# Patient Record
Sex: Female | Born: 2000 | Race: Black or African American | Hispanic: No | Marital: Single | Smoking: Never smoker
Health system: Southern US, Community
[De-identification: ages and names within clinical notes are randomized; demographics above are authoritative.]

---
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
## 2023-04-03 ENCOUNTER — Other Ambulatory Visit: Payer: Self-pay

## 2023-04-03 ENCOUNTER — Ambulatory Visit (HOSPITAL_COMMUNITY)
Admission: EM | Admit: 2023-04-03 | Discharge: 2023-04-03 | Disposition: A | Discharge status: Other Acute Inpatient Hospital | Payer: Medicaid Other | Attending: Behavioral Health | Admitting: Behavioral Health

## 2023-04-03 ENCOUNTER — Encounter (HOSPITAL_COMMUNITY): Payer: Self-pay | Admitting: Behavioral Health

## 2023-04-03 ENCOUNTER — Inpatient Hospital Stay (HOSPITAL_COMMUNITY)
Admission: AD | Admit: 2023-04-03 | Discharge: 2023-04-11 | DRG: 885 | Disposition: A | Discharge status: Home / Self Care | Payer: Medicaid Other | Source: Intra-hospital | Attending: Psychiatry | Admitting: Psychiatry

## 2023-04-03 DIAGNOSIS — F17290 Nicotine dependence, other tobacco product, uncomplicated: Secondary | ICD-10-CM | POA: Diagnosis present

## 2023-04-03 DIAGNOSIS — R45851 Suicidal ideations: Secondary | ICD-10-CM | POA: Diagnosis present

## 2023-04-03 DIAGNOSIS — R000 Tachycardia, unspecified: Secondary | ICD-10-CM | POA: Insufficient documentation

## 2023-04-03 DIAGNOSIS — Z5941 Food insecurity: Secondary | ICD-10-CM

## 2023-04-03 DIAGNOSIS — Z560 Unemployment, unspecified: Secondary | ICD-10-CM

## 2023-04-03 DIAGNOSIS — F411 Generalized anxiety disorder: Secondary | ICD-10-CM | POA: Diagnosis present

## 2023-04-03 DIAGNOSIS — Z818 Family history of other mental and behavioral disorders: Secondary | ICD-10-CM

## 2023-04-03 DIAGNOSIS — K5900 Constipation, unspecified: Secondary | ICD-10-CM | POA: Diagnosis present

## 2023-04-03 DIAGNOSIS — F332 Major depressive disorder, recurrent severe without psychotic features: Secondary | ICD-10-CM | POA: Diagnosis present

## 2023-04-03 DIAGNOSIS — K30 Functional dyspepsia: Secondary | ICD-10-CM | POA: Diagnosis present

## 2023-04-03 DIAGNOSIS — F4310 Post-traumatic stress disorder, unspecified: Secondary | ICD-10-CM | POA: Insufficient documentation

## 2023-04-03 DIAGNOSIS — Z79899 Other long term (current) drug therapy: Secondary | ICD-10-CM

## 2023-04-03 LAB — POCT URINE DRUG SCREEN - MANUAL ENTRY (I-SCREEN)
POC Amphetamine UR: NOT DETECTED
POC Buprenorphine (BUP): NOT DETECTED
POC Cocaine UR: NOT DETECTED
POC Marijuana UR: POSITIVE — AB
POC Methadone UR: NOT DETECTED
POC Methamphetamine UR: NOT DETECTED
POC Morphine: NOT DETECTED
POC Oxazepam (BZO): NOT DETECTED
POC Oxycodone UR: NOT DETECTED
POC Secobarbital (BAR): NOT DETECTED

## 2023-04-03 LAB — HEMOGLOBIN A1C
Hgb A1c MFr Bld: 5.2 % (ref 4.8–5.6)
Mean Plasma Glucose: 102.54 mg/dL

## 2023-04-03 LAB — CBC WITH DIFFERENTIAL/PLATELET
Abs Immature Granulocytes: 0 10*3/uL (ref 0.00–0.07)
Basophils Absolute: 0 10*3/uL (ref 0.0–0.1)
Basophils Relative: 1 %
Eosinophils Absolute: 0 10*3/uL (ref 0.0–0.5)
Eosinophils Relative: 1 %
HCT: 40 % (ref 36.0–46.0)
Hemoglobin: 13.4 g/dL (ref 12.0–15.0)
Immature Granulocytes: 0 %
Lymphocytes Relative: 49 %
Lymphs Abs: 1.4 10*3/uL (ref 0.7–4.0)
MCH: 30.9 pg (ref 26.0–34.0)
MCHC: 33.5 g/dL (ref 30.0–36.0)
MCV: 92.2 fL (ref 80.0–100.0)
Monocytes Absolute: 0.2 10*3/uL (ref 0.1–1.0)
Monocytes Relative: 8 %
Neutro Abs: 1.2 10*3/uL — ABNORMAL LOW (ref 1.7–7.7)
Neutrophils Relative %: 41 %
Platelets: 213 10*3/uL (ref 150–400)
RBC: 4.34 MIL/uL (ref 3.87–5.11)
RDW: 12 % (ref 11.5–15.5)
WBC: 2.9 10*3/uL — ABNORMAL LOW (ref 4.0–10.5)
nRBC: 0 % (ref 0.0–0.2)

## 2023-04-03 LAB — LIPID PANEL
Cholesterol: 225 mg/dL — ABNORMAL HIGH (ref 0–200)
HDL: 76 mg/dL (ref >40)
LDL Cholesterol: 140 mg/dL — ABNORMAL HIGH (ref 0–99)
Total CHOL/HDL Ratio: 3 RATIO
Triglycerides: 44 mg/dL (ref <150)
VLDL: 9 mg/dL (ref 0–40)

## 2023-04-03 LAB — COMPREHENSIVE METABOLIC PANEL
ALT: 14 U/L (ref 0–44)
AST: 18 U/L (ref 15–41)
Albumin: 4.5 g/dL (ref 3.5–5.0)
Alkaline Phosphatase: 54 U/L (ref 38–126)
Anion gap: 13 (ref 5–15)
BUN: <5 mg/dL — ABNORMAL LOW (ref 6–20)
CO2: 22 mmol/L (ref 22–32)
Calcium: 9.6 mg/dL (ref 8.9–10.3)
Chloride: 103 mmol/L (ref 98–111)
Creatinine, Ser: 0.65 mg/dL (ref 0.44–1.00)
GFR, Estimated: >60 mL/min (ref >60)
Glucose, Bld: 85 mg/dL (ref 70–99)
Potassium: 4.1 mmol/L (ref 3.5–5.1)
Sodium: 138 mmol/L (ref 135–145)
Total Bilirubin: 0.7 mg/dL (ref 0.3–1.2)
Total Protein: 8.7 g/dL — ABNORMAL HIGH (ref 6.5–8.1)

## 2023-04-03 LAB — POC URINE PREG, ED: Preg Test, Ur: NEGATIVE

## 2023-04-03 LAB — MAGNESIUM: Magnesium: 2 mg/dL (ref 1.7–2.4)

## 2023-04-03 LAB — ETHANOL: Alcohol, Ethyl (B): <10 mg/dL (ref <10)

## 2023-04-03 LAB — TSH: TSH: 0.657 u[IU]/mL (ref 0.350–4.500)

## 2023-04-03 LAB — POCT PREGNANCY, URINE: Preg Test, Ur: NEGATIVE

## 2023-04-03 MED ORDER — MAGNESIUM HYDROXIDE 400 MG/5ML PO SUSP: 30.0000 mL | Freq: Every day | ORAL | Status: DC | PRN

## 2023-04-03 MED ORDER — TRAZODONE HCL 50 MG PO TABS: 50.0000 mg | ORAL_TABLET | Freq: Every evening | ORAL | Status: DC | PRN

## 2023-04-03 MED ORDER — ALUM & MAG HYDROXIDE-SIMETH 200-200-20 MG/5ML PO SUSP: 30.0000 mL | ORAL | Status: DC | PRN

## 2023-04-03 MED ORDER — QUETIAPINE FUMARATE ER 50 MG PO TB24: 50.0000 mg | ORAL_TABLET | Freq: Every day | ORAL | Status: DC

## 2023-04-03 MED ORDER — ACETAMINOPHEN 325 MG PO TABS: 650.0000 mg | ORAL_TABLET | Freq: Four times a day (QID) | ORAL | Status: DC | PRN

## 2023-04-03 MED ORDER — HYDROXYZINE HCL 25 MG PO TABS: 25.0000 mg | ORAL_TABLET | Freq: Three times a day (TID) | ORAL | Status: DC | PRN

## 2023-04-03 NOTE — ED Provider Notes (Cosign Needed Addendum)
[facility]gent [facility] Continuous Assessment Admission H&P  Date: 04/03/23 Patient Name: Charlotte Norman MRN: [id number] Chief Complaint: "My therapist called the police"  Diagnoses:  Final diagnoses:  MDD (major depressive disorder), recurrent severe, without psychosis (HCC)  Suicidal ideation  GAD (generalized anxiety disorder)    HPI: Charlotte Norman is a 22 y.o. female patient with a past psychiatric history of MDD and GAD who presented to [facility] voluntarily via [facility] with complaints of suicidal ideation and worsening depression and anxiety. Patient reports her therapist contacted police to bring her to [facility] after she expressed suicidal thoughts during her visit today.  Patient assessed face-to-face by this provider and chart reviewed on 04/03/23. Counselor Beryle Flock present during assessment. On evaluation, Charlotte Norman is seated in assessment area in no acute distress. Patient is alert and oriented x4, cooperative and pleasant. Speech is clear and coherent, normal rate and volume. Eye contact is fair. Mood is anxious and depressed with tearful and congruent affect. Thought process is coherent with thought content that consists of suicidal ideations. Patient reports passive suicidal ideations with no specific plan or intent, however states she has envisioned herself cutting her wrists. Patient states she has experienced ongoing suicidal ideations since graduating high school, "I think about it every day, I wish I didn't exist, I wake up every morning disappointed and start crying because it's another day, I really want to die, with all my being I wish I never existed, I don't feel equipped to deal with disappointment, I don't understand why I try." Patient states her suicidal ideations worsened yesterday after she had a panic attack following a conversation with her brother in which he told her to "give up housing on campus" when she expressed she can't afford to go  back to school and live on campus. Patient unable to contact for safety at this time. Patient reports in 2021 she had thoughts of overdosing on her prescribed Lexapro but did not follow through with an attempt. Patient reports she has experienced panic attacks for 4 years and typically has 2-3/week. Patient denies homicidal ideations. Patient denies a history of self-harm. Patient denies past psychiatric hospitalizations. Patient denies auditory and visual hallucinations. Patient denies symptoms of paranoia but states she always feels as if she is doing something wrong or is in trouble. Patient is able to converse coherently with goal-directed thoughts and no distractibility or preoccupation. Objectively, there is no evidence of psychosis/mania, delusional thinking, or indication that patient is responding to internal or external stimuli.  Patient reports increased sleep (12 hours/night) and decreased appetite. Patient states she is currently residing with her mother and denies access to weapons/firearms. Patient states she will be entering her Sophomore year at [employer]. Patient reports she used to attend [employer] but experienced bullying from her roommate and had to leave. Patient reports marijuana use 1.5 months ago. Patient states when she is able to afford marijuana, she will smoke it daily. Patient denies use of alcohol or other illicit substances. Patient states she had her first visit with her therapist "Shauntay" at [facility] today and will be seeing her 1x/week. Patient states she is currently prescribed Seroquel 25mg  BID at [facility] but hasn't taken it in over a week due to being sick. Patient states in 2021, she was prescribed Lexapro but "it made me worse so they thought I may have a mood disorder." Patient states her therapist is currently screening her for a mood disorder. Patient reports her biological father has history of paranoia, half-brother  has "signs of schizophrenia," another half-brother has  bipolar disorder, and "all my family has mental disorders." Patient states "may I should end it all before I go off the deep end." Patient shares that she has no friends, her brothers are her support system, and her mother "doesn't believe in mental health."   Patient offered support and encouragement. Discussed with patient recommendations for inpatient psychiatric treatment. Discussed with patient continuing Seroquel but taking Seroquel XR 50mg  daily at bedtime. Discussed with patient admission to the continuous observation unit for safety monitoring while awaiting inpatient psychiatric placement. Patient is in agreement with plan of care.   Total Time spent with patient: 1 hour  Musculoskeletal  Strength & Muscle Tone: within normal limits Gait & Station: normal Patient leans: N/A  Psychiatric Specialty Exam  Presentation General Appearance:  Appropriate for Environment  Eye Contact: Fair  Speech: Clear and Coherent; Normal Rate  Speech Volume: Normal  Handedness: Right   Mood and Affect  Mood: Depressed; Anxious  Affect: Congruent; Tearful   Thought Process  Thought Processes: Coherent; Goal Directed  Descriptions of Associations:Intact  Orientation:Full (Time, Place and Person)  Thought Content:Logical    Hallucinations:Hallucinations: None  Ideas of Reference:None  Suicidal Thoughts:Suicidal Thoughts: Yes, Passive SI Passive Intent and/or Plan: Without Plan; Without Intent; Without Means to Carry Out; Without Access to Means  Homicidal Thoughts:Homicidal Thoughts: No   Sensorium  Memory: Immediate Good; Recent Good; Remote Good  Judgment: Fair  Insight: Fair   Art therapist  Concentration: Good  Attention Span: Good  Recall: Good  Fund of Knowledge: Good  Language: Good   Psychomotor Activity  Psychomotor Activity: Psychomotor Activity: Normal   Assets  Assets: Communication Skills; Desire for Improvement;  Financial Resources/Insurance; Housing; Leisure Time; Physical Health; Resilience; Social Support; Talents/Skills; Transportation   Sleep  Sleep: Sleep: Good Number of Hours of Sleep: 12   Nutritional Assessment (For OBS and FBC admissions only) Has the patient had a weight loss or gain of 10 pounds or more in the last 3 months?: No Has the patient had a decrease in food intake/or appetite?: Yes Does the patient have dental problems?: No Does the patient have eating habits or behaviors that may be indicators of an eating disorder including binging or inducing vomiting?: No Has the patient recently lost weight without trying?: 0 Has the patient been eating poorly because of a decreased appetite?: 1 Malnutrition Screening Tool Score: 1    Physical Exam Vitals and nursing note reviewed.  Constitutional:      General: She is not in acute distress.    Appearance: Normal appearance. She is not ill-appearing.  HENT:     Head: Normocephalic and atraumatic.     Nose: Nose normal.  Eyes:     General:        Right eye: No discharge.        Left eye: No discharge.     Conjunctiva/sclera: Conjunctivae normal.  Cardiovascular:     Rate and Rhythm: Tachycardia present.  Pulmonary:     Effort: Pulmonary effort is normal. No respiratory distress.  Musculoskeletal:        General: Normal range of motion.     Cervical back: Normal range of motion.  Skin:    General: Skin is warm and dry.  Neurological:     General: No focal deficit present.     Mental Status: She is alert and oriented to person, place, and time. Mental status is at baseline.  Psychiatric:  Attention and Perception: Attention and perception normal.        Mood and Affect: Mood is anxious and depressed. Affect is tearful.        Speech: Speech normal.        Behavior: Behavior normal. Behavior is cooperative.        Thought Content: Thought content is not paranoid or delusional. Thought content includes suicidal  ideation. Thought content does not include homicidal ideation. Thought content does not include homicidal or suicidal plan.        Cognition and Memory: Cognition and memory normal.        Judgment: Judgment normal.    Review of Systems  Constitutional: Negative.   HENT: Negative.    Eyes: Negative.   Respiratory: Negative.    Cardiovascular: Negative.   Gastrointestinal: Negative.   Genitourinary: Negative.   Musculoskeletal: Negative.   Skin: Negative.   Neurological: Negative.   Endo/Heme/Allergies: Negative.   Psychiatric/Behavioral:  Positive for depression and suicidal ideas. Negative for hallucinations, memory loss and substance abuse. The patient is nervous/anxious. The patient does not have insomnia.     Blood pressure (!) 134/92, pulse (!) 117, temperature 98.3 F (36.8 C), temperature source Oral, resp. rate 20, SpO2 100%. There is no height or weight on file to calculate BMI.  Past Psychiatric History: MDD, GAD  Is the patient at risk to self? Yes  Has the patient been a risk to self in the past 6 months? Yes .    Has the patient been a risk to self within the distant past? Yes   Is the patient a risk to others? No   Has the patient been a risk to others in the past 6 months? No   Has the patient been a risk to others within the distant past? No   Past Medical History: None reported  Family History:  Family History  Problem Relation Age of Onset   Asthma Other   -Patient reports biological father has hx of paranoia, half-brother has "signs of schizophrenia," another half-brother has bipolar disorder, and "all my family has mental disorders"  Social History:  Social History   Tobacco Use   Smoking status: Never   Smokeless tobacco: Never  Substance Use Topics   Alcohol use: No   Drug use: Never   Last Labs:  No visits with results within 6 Month(s) from this visit.  Latest known visit with results is:  Office Visit on 06/30/2019  Component Date Value  Ref Range Status   Preg Test, Ur 06/30/2019 Negative  Negative Final   Neisseria Gonorrhea 06/30/2019 Negative   Final   Chlamydia 06/30/2019 Positive (A)   Final   Trichomonas 06/30/2019 Negative   Final   Bacterial Vaginitis (gardnerella) 06/30/2019 Positive (A)   Final   Candida Vaginitis 06/30/2019 Negative   Final   Candida Glabrata 06/30/2019 Negative   Final   Comment 06/30/2019 Normal Reference Range Bacterial Vaginosis - Negative   Final   Comment 06/30/2019 Normal Reference Range Candida Species - Negative   Final   Comment 06/30/2019 Normal Reference Range Candida Galbrata - Negative   Final   Comment 06/30/2019 Normal Reference Range Trichomonas - Negative   Final   Comment 06/30/2019 Normal Reference Ranger Chlamydia - Negative   Final   Comment 06/30/2019 Normal Reference Range Neisseria Gonorrhea - Negative   Final   HIV Screen 4th Generation wRfx 06/30/2019 Non Reactive  Non Reactive Final   WBC 06/30/2019 4.2  3.4 -  10.8 x10E3/uL Final   RBC 06/30/2019 4.24  3.77 - 5.28 x10E6/uL Final   Hemoglobin 06/30/2019 12.7  11.1 - 15.9 g/dL Final   Hematocrit 16/06/9603 38.4  34.0 - 46.6 % Final   MCV 06/30/2019 91  79 - 97 fL Final   MCH 06/30/2019 30.0  26.6 - 33.0 pg Final   MCHC 06/30/2019 33.1  31.5 - 35.7 g/dL Final   RDW 54/05/8118 12.9  11.7 - 15.4 % Final   Platelets 06/30/2019 377  150 - 450 x10E3/uL Final   Neutrophils 06/30/2019 50  Not Estab. % Final   Lymphs 06/30/2019 35  Not Estab. % Final   Monocytes 06/30/2019 13  Not Estab. % Final   Eos 06/30/2019 1  Not Estab. % Final   Basos 06/30/2019 1  Not Estab. % Final   Neutrophils Absolute 06/30/2019 2.1  1.4 - 7.0 x10E3/uL Final   Lymphocytes Absolute 06/30/2019 1.4  0.7 - 3.1 x10E3/uL Final   Monocytes Absolute 06/30/2019 0.6  0.1 - 0.9 x10E3/uL Final   EOS (ABSOLUTE) 06/30/2019 0.1  0.0 - 0.4 x10E3/uL Final   Basophils Absolute 06/30/2019 0.0  0.0 - 0.3 x10E3/uL Final   Immature Granulocytes 06/30/2019 0  Not  Estab. % Final   Immature Grans (Abs) 06/30/2019 0.0  0.0 - 0.1 x10E3/uL Final   Glucose 06/30/2019 93  65 - 99 mg/dL Final   BUN 14/78/2956 7  5 - 18 mg/dL Final   Creatinine, Ser 06/30/2019 0.76  0.57 - 1.00 mg/dL Final   GFR calc non Af Amer 06/30/2019 CANCELED  mL/min/1.73 Final-Edited   Comment: Unable to calculate GFR.  Age and/or sex not provided or age <84 years old.  Result canceled by the ancillary.    GFR calc Af Amer 06/30/2019 CANCELED  mL/min/1.73 Final-Edited   Comment: Unable to calculate GFR.  Age and/or sex not provided or age <21 years old.  Result canceled by the ancillary.    BUN/Creatinine Ratio 06/30/2019 9 (L)  10 - 22 Final   Sodium 06/30/2019 140  134 - 144 mmol/L Final   Potassium 06/30/2019 4.9  3.5 - 5.2 mmol/L Final   Chloride 06/30/2019 104  96 - 106 mmol/L Final   CO2 06/30/2019 22  20 - 29 mmol/L Final   Calcium 06/30/2019 9.8  8.9 - 10.4 mg/dL Final   Total Protein 21/30/8657 7.7  6.0 - 8.5 g/dL Final   Albumin 84/69/6295 4.6  3.9 - 5.0 g/dL Final   Globulin, Total 06/30/2019 3.1  1.5 - 4.5 g/dL Final   Albumin/Globulin Ratio 06/30/2019 1.5  1.2 - 2.2 Final   Bilirubin Total 06/30/2019 0.2  0.0 - 1.2 mg/dL Final   Alkaline Phosphatase 06/30/2019 82  45 - 101 IU/L Final   AST 06/30/2019 15  0 - 40 IU/L Final   ALT 06/30/2019 10  0 - 24 IU/L Final    Allergies: Penicillins  Medications:  Facility Ordered Medications  Medication   acetaminophen (TYLENOL) tablet 650 mg   alum & mag hydroxide-simeth (MAALOX/MYLANTA) 200-200-20 MG/5ML suspension 30 mL   magnesium hydroxide (MILK OF MAGNESIA) suspension 30 mL   hydrOXYzine (ATARAX) tablet 25 mg   QUEtiapine (SEROQUEL XR) 24 hr tablet 50 mg   PTA Medications  Medication Sig   TRI-LO-MARZIA 0.18/0.215/0.25 MG-25 MCG tab TAKE 1 TABLET BY MOUTH EVERY DAY      Medical Decision Making  Charlotte Norman was admitted to [facility] continuous assessment unit  for MDD, recurrent, severe, without  psychosis, suicidal ideation, GAD (generalized anxiety disorder), crisis management, medication administration, and stabilization. Patient remains voluntary.  Routine labs ordered, which include Lab Orders         CBC with Differential/Platelet         Comprehensive metabolic panel         Hemoglobin A1c         Magnesium         Ethanol         Lipid panel         TSH         Prolactin         POC urine preg, ED         POCT Urine Drug Screen - (I-Screen)    -EKG Medication Management: Medications started, continue home medications as appropriate  Meds ordered this encounter  Medications   acetaminophen (TYLENOL) tablet 650 mg   alum & mag hydroxide-simeth (MAALOX/MYLANTA) 200-200-20 MG/5ML suspension 30 mL   magnesium hydroxide (MILK OF MAGNESIA) suspension 30 mL   hydrOXYzine (ATARAX) tablet 25 mg   DISCONTD: traZODone (DESYREL) tablet 50 mg   QUEtiapine (SEROQUEL XR) 24 hr tablet 50 mg    Will maintain observation checks every 15 minutes for safety. Psychosocial education regarding relapse prevention and self-care; social and communication   Recommendations  Based on my evaluation the patient does not appear to have an emergency medical condition.  -Start Seroquel XR 50mg  daily at bedtime -PRN orders placed for: Tylenol 650mg  Q6H PRN mild pain, Maalox 30ml Q4H PRN indigestion, hydroxyzine 25mg  TID PRN anxiety, and Milk of Magnesia 30ml daily PRN mild constipation -Recommend inpatient psychiatric treatment. Per Rona Ravens, RN/[facility], patient has been accepted to [facility] [phone] today pending resulted lab work, voluntary consent form faxed, and EKG, accepting provider will be Dr. Sherron Flemings.  Sunday Corn, NP 04/03/23  5:45 PM

## 2023-04-03 NOTE — ED Notes (Signed)
Safe Transport requested to [facility], [address].
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
## 2023-04-03 NOTE — ED Notes (Signed)
22 yo AAF, present to [employer] [facility], secondary to making suicidal statement. She is A&O to all spheres. Patient denied having active though, plan or intent at this time, stating it "wing and wang' based on the situation, Pt reports stressors r/t to finances, student loans and is currently unemployed and living with her mother. Pt also denied having any medical issues and she did not present in any acute distress. Pt noted with moderate acne on face and upper torso otherwise unremarkable, slimmer fit. Pt does have tattoos to her left wrist, and lower right leg. Pt belongings was searched and was without contraband. She reports using THC at times but, none for the past three weeks, denies ETOH use and stated she vape s using nicotine. Patient placed in bed #5, she was pleasant and cooperative, was unable to give urine for UDS at time of admission. Pt also denied hx. Of abuse. Will continue to monitor for safety, support as needed and will transfer her to higher level of care after diagnotic results are obtain according to her provider.

## 2023-04-03 NOTE — ED Notes (Signed)
Pt A&O x 4, no distress noted, awake & watching TV at present, pending lab results & report & transfer to [facility].  MOnitoring for safety.

## 2023-04-03 NOTE — Progress Notes (Signed)
   04/03/23 1608  [facility] Triage Screening (Walk-ins at [facility] only)  How Did You Hear About [place]?  (new OP therapist)  What Is the Reason for Your Visit/Call Today? Pt is a 22 yo female who presented voluntarily and unaccompanied via [employer] after a call from her new OP therapist with [facility]. After screening questions her therapist found that pt was having passive suicidal thoughts such as "I wish I didn't exist." Pt reported currently having suicidal thoughts "everyday. " Pt stated that her thoughts increased in intensity after a discussion with her brother last night about her immediate future plans to continue in college next semester. Pt stated that she was and is very disappointed that she may not be able to afford college next semester. Pt stated she feels like she has no future and no purpose now. Today was pt's first appointment with her OP therapist who she plans to see once a week. Pt is currently prescribed Seroquel which she is not taking due to unwanted side effects. A provider at [facility] is also providing medication management. Pt denied active SI (no plan), HI, NSSH, AVH, paranoia and all substance use except occasional (monthly) cannabis use.  How Long Has This Been Causing You Problems? > than 6 months  Have You Recently Had Any Thoughts About Hurting Yourself? Yes  How long ago did you have thoughts about hurting yourself? currently and daily  Are You Planning to Commit Suicide/Harm Yourself At This time? No  Have you Recently Had Thoughts About Hurting Someone Karolee Ohs? No  Are You Planning To Harm Someone At This Time? No  Are you currently experiencing any auditory, visual or other hallucinations? No  Have You Used Any Alcohol or Drugs in the Past 24 Hours? No  Do you have any current medical co-morbidities that require immediate attention? No  Clinician description of patient physical appearance/behavior: Pt was calm, cooperative, tearful, sad and quiet. Pt was alert and fully oriented.  Pt was dressed casually and neatly and appeared to be adequately groomed. Pt's mood was sad and depressed and her flat affect was congruent. Pt's judgment and insight were fair.  What Do You Feel Would Help You the Most Today? Treatment for Depression or other mood problem  If access to [facility] was not available, would you have sought care in the Emergency Department? Yes  Determination of Need Emergent (2 hours) (Per Eliezer Champagne NP pt is recommended for Inpatient psychiatric treatment.)  Options For Referral Inpatient Hospitalization

## 2023-04-03 NOTE — ED Notes (Signed)
Report called to [employer], [facility] rm [phone].  Librarian, academic.

## 2023-04-04 ENCOUNTER — Encounter (HOSPITAL_COMMUNITY): Payer: Self-pay | Admitting: Behavioral Health

## 2023-04-04 DIAGNOSIS — F4310 Post-traumatic stress disorder, unspecified: Secondary | ICD-10-CM | POA: Insufficient documentation

## 2023-04-04 MED ORDER — HYDROXYZINE HCL 25 MG PO TABS
25.0000 mg | ORAL_TABLET | Freq: Three times a day (TID) | ORAL | Status: DC | PRN
  Administered 2023-04-05 – 2023-04-10 (×6): 25 mg via ORAL
  Filled 2023-04-04 (×8): qty 1

## 2023-04-04 MED ORDER — HALOPERIDOL 5 MG PO TABS: 5.0000 mg | ORAL_TABLET | Freq: Three times a day (TID) | ORAL | Status: DC | PRN

## 2023-04-04 MED ORDER — ALUM & MAG HYDROXIDE-SIMETH 200-200-20 MG/5ML PO SUSP: 30.0000 mL | ORAL | Status: DC | PRN

## 2023-04-04 MED ORDER — VENLAFAXINE HCL ER 37.5 MG PO CP24
37.5000 mg | ORAL_CAPSULE | Freq: Every day | ORAL | Status: DC
  Administered 2023-04-04: 37.5 mg via ORAL
  Filled 2023-04-04 (×4): qty 1

## 2023-04-04 MED ORDER — DIPHENHYDRAMINE HCL 50 MG/ML IJ SOLN: 50.0000 mg | Freq: Three times a day (TID) | INTRAMUSCULAR | Status: DC | PRN

## 2023-04-04 MED ORDER — ACETAMINOPHEN 325 MG PO TABS
650.0000 mg | ORAL_TABLET | Freq: Four times a day (QID) | ORAL | Status: DC | PRN
  Administered 2023-04-04 – 2023-04-08 (×3): 650 mg via ORAL
  Filled 2023-04-04 (×3): qty 2

## 2023-04-04 MED ORDER — LORAZEPAM 2 MG/ML IJ SOLN: 2.0000 mg | Freq: Three times a day (TID) | INTRAMUSCULAR | Status: DC | PRN

## 2023-04-04 MED ORDER — DIPHENHYDRAMINE HCL 25 MG PO CAPS: 50.0000 mg | ORAL_CAPSULE | Freq: Three times a day (TID) | ORAL | Status: DC | PRN

## 2023-04-04 MED ORDER — LORAZEPAM 1 MG PO TABS: 2.0000 mg | ORAL_TABLET | Freq: Three times a day (TID) | ORAL | Status: DC | PRN

## 2023-04-04 MED ORDER — QUETIAPINE FUMARATE ER 50 MG PO TB24
50.0000 mg | ORAL_TABLET | Freq: Every day | ORAL | Status: DC
  Administered 2023-04-04 – 2023-04-10 (×7): 50 mg via ORAL
  Filled 2023-04-04 (×11): qty 1

## 2023-04-04 MED ORDER — VENLAFAXINE HCL ER 75 MG PO CP24
75.0000 mg | ORAL_CAPSULE | Freq: Every day | ORAL | Status: DC
  Administered 2023-04-05 – 2023-04-06 (×2): 75 mg via ORAL
  Filled 2023-04-04 (×4): qty 1

## 2023-04-04 MED ORDER — MAGNESIUM HYDROXIDE 400 MG/5ML PO SUSP: 30.0000 mL | Freq: Every day | ORAL | Status: DC | PRN

## 2023-04-04 MED ORDER — HALOPERIDOL LACTATE 5 MG/ML IJ SOLN: 5.0000 mg | Freq: Three times a day (TID) | INTRAMUSCULAR | Status: DC | PRN

## 2023-04-04 NOTE — Tx Team (Signed)
Initial Treatment Plan 04/04/2023 1:54 AM Charlotte Norman [id number]    PATIENT STRESSORS: Educational concerns   Financial difficulties     PATIENT STRENGTHS: Ability for insight  Active sense of humor  Communication skills  General fund of knowledge  Motivation for treatment/growth    PATIENT IDENTIFIED PROBLEMS: SI  Depression  "I want to be able to cope with life without wanting to kill myself"                 DISCHARGE CRITERIA:  Improved stabilization in mood, thinking, and/or behavior Need for constant or close observation no longer present Reduction of life-threatening or endangering symptoms to within safe limits  PRELIMINARY DISCHARGE PLAN: Outpatient therapy Placement in alternative living arrangements  PATIENT/FAMILY INVOLVEMENT: This treatment plan has been presented to and reviewed with the patient, Charlotte Norman. The patient has been given the opportunity to ask questions and make suggestions.  Daneil Dan, RN 04/04/2023, 1:54 AM
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
## 2023-04-04 NOTE — Group Note (Signed)
Date:  04/04/2023 Time:  10:06 AM  Group Topic/Focus:  Goals Group:   The focus of this group is to help patients establish daily goals to achieve during treatment and discuss how the patient can incorporate goal setting into their daily lives to aide in recovery.    Participation Level:  Active  Participation Quality:  Appropriate  Affect:  Appropriate  Cognitive:  Appropriate  Insight: Appropriate  Engagement in Group:  Engaged  Modes of Intervention:  Discussion  Additional Comments:     Reymundo Poll 04/04/2023, 10:06 AM

## 2023-04-04 NOTE — [facility] Suicide Risk Assessment (Signed)
[facility] INPATIENT:  Family/Significant Other Suicide Prevention Education  Suicide Prevention Education:  Education Completed; 04-04-2023,  Charlotte Norman (Mother) [phone] been identified by the patient as the family member/significant other with whom the patient will be residing, and identified as the person(s) who will aid the patient in the event of a mental health crisis (suicidal ideations/suicide attempt).  With written consent from the patient, the family member/significant other has been provided the following suicide prevention education, prior to the and/or following the discharge of the patient.  The suicide prevention education provided includes the following: Suicide risk factors Suicide prevention and interventions National Suicide Hotline telephone number [facility] assessment telephone number [facility] Emergency Assistance 911 [facility] and/or Residential Mobile Crisis Unit telephone number  Request made of family/significant other to: Remove weapons (e.g., guns, rifles, knives), all items previously/currently identified as safety concern.   Remove drugs/medications (over-the-counter, prescriptions, illicit drugs), all items previously/currently identified as a safety concern.  Charlotte Norman (Mother) [phone] verbalizes understanding of the suicide prevention education information provided.  The family member/significant other agrees to remove the items of safety concern listed above.  Charlotte Norman 04/04/2023, 2:38 PM

## 2023-04-04 NOTE — Plan of Care (Signed)
  Problem: Education: Goal: Knowledge of [place] [facility] Education information/materials will improve Outcome: Progressing   
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
## 2023-04-04 NOTE — Group Note (Signed)
    Type of Therapy and Topic: Group Therapy: Relationship Check-Ins   Participation Level: Did Not Attend   Description Group:  In this group patients are encouraged to rate how well or not well they are able to improve their relationships in Beliefs and Values, Communication, Family and Friends, Actuary and Household, and Intimacy. Patients will be able to discuss and identify what is going well within these aspects and what is not. Patients will be able to find appropriate ways, solutions, and skills that will help them within the selected relationships category. Patients will be encouraged to share and reflect on why things within their relationships are not going so well and get feedback from the instructor or their peers.  This group will be solution focused and process-oriented with patients' participation in sharing and listening to their own and peers experience; along with receive support and advice on how to improve or change the circumstance that is known to be challenging in their relationships category (Beliefs and Values, Communication, Family and Friends, Actuary and Household, and Intimacy).   Therapeutic Goals:  Patient will identify their strengths and weakness within their Beliefs and Values, Communication, Family and Friends, Actuary and Household, and Intimacy relationships. Patient will identify reasons why and how they can improve their relationships.  Patient will identify how they can be supportive and honest to themselves and in their relationships.  Patient will be able to gain support and give support to others with similar challenges.   Summary of Patient Progress    Therapeutic Modalities:  Solution Focused Therapy Cognitive Behavioral Therapy  Psychodynamic Therapy  Dialectical Behavior Therapy    Marinda Elk, LCSW 04/04/2023  1:23 PM

## 2023-04-04 NOTE — Group Note (Signed)
Date:  04/04/2023 Time:  6:13 PM  Group Topic/Focus:  Wellness Toolbox:   The focus of this group is to discuss various aspects of wellness, balancing those aspects and exploring ways to increase the ability to experience wellness.  Patients will create a wellness toolbox for use upon discharge.    Participation Level:  Active  Participation Quality:  Appropriate  Affect:  Appropriate  Cognitive:  Appropriate  Insight: Appropriate  Engagement in Group:  Engaged  Modes of Intervention:  Exploration  Additional Comments:     Reymundo Poll 04/04/2023, 6:13 PM

## 2023-04-04 NOTE — Progress Notes (Signed)
   04/04/23 0601  15 Minute Checks  Location Bedroom  Visual Appearance Calm  Behavior Sleeping  Sleep (Behavioral Health Patients Only)  Calculate sleep? (Click Yes once per 24 hr at 0600 safety check) Yes  Documented sleep last 24 hours 5.25

## 2023-04-05 ENCOUNTER — Encounter (HOSPITAL_COMMUNITY): Payer: Self-pay

## 2023-04-05 DIAGNOSIS — F332 Major depressive disorder, recurrent severe without psychotic features: Secondary | ICD-10-CM | POA: Diagnosis not present

## 2023-04-05 LAB — CBC WITH DIFFERENTIAL/PLATELET
Abs Immature Granulocytes: 0.01 10*3/uL (ref 0.00–0.07)
Basophils Absolute: 0 10*3/uL (ref 0.0–0.1)
Basophils Relative: 1 %
Eosinophils Absolute: 0.1 10*3/uL (ref 0.0–0.5)
Eosinophils Relative: 2 %
HCT: 35.6 % — ABNORMAL LOW (ref 36.0–46.0)
Hemoglobin: 11.7 g/dL — ABNORMAL LOW (ref 12.0–15.0)
Immature Granulocytes: 0 %
Lymphocytes Relative: 61 %
Lymphs Abs: 2.4 10*3/uL (ref 0.7–4.0)
MCH: 30.5 pg (ref 26.0–34.0)
MCHC: 32.9 g/dL (ref 30.0–36.0)
MCV: 92.7 fL (ref 80.0–100.0)
Monocytes Absolute: 0.3 10*3/uL (ref 0.1–1.0)
Monocytes Relative: 9 %
Neutro Abs: 1.1 10*3/uL — ABNORMAL LOW (ref 1.7–7.7)
Neutrophils Relative %: 27 %
Platelets: 361 10*3/uL (ref 150–400)
RBC: 3.84 MIL/uL — ABNORMAL LOW (ref 3.87–5.11)
RDW: 12 % (ref 11.5–15.5)
WBC: 3.9 10*3/uL — ABNORMAL LOW (ref 4.0–10.5)
nRBC: 0 % (ref 0.0–0.2)

## 2023-04-05 NOTE — Progress Notes (Signed)
   04/05/23 0603  15 Minute Checks  Location Bedroom  Visual Appearance Calm  Behavior Sleeping  Sleep (Behavioral Health Patients Only)  Calculate sleep? (Click Yes once per 24 hr at 0600 safety check) Yes  Documented sleep last 24 hours 8

## 2023-04-05 NOTE — Progress Notes (Signed)
[facility] MD Progress Note  04/05/2023 12:59 PM Charlotte Norman  MRN:  [id number]  Principal Problem: MDD (major depressive disorder), recurrent severe, without psychosis (HCC) Diagnosis: Principal Problem:   MDD (major depressive disorder), recurrent severe, without psychosis (HCC) Active Problems:   Suicidal ideation   GAD (generalized anxiety disorder)   PTSD (post-traumatic stress disorder)  Reason for Admission: Staphanie "Elease Norman" Norman is a 22 yr old female who presented to [facility] on 7/24 with worsening depression and Passive SI. PPHx is significant for Depression, Anxiety, and PTSD, and 1 Suicide Attempt (OD-2022), and no history of Self Injurious Behavior or Prior Psychiatric Hospitalizations.   24 hr chart review: Sleep Hours last night: 8 hrs per nursing documentation. Slept well as per patient.  Nursing Concerns: none  Behavioral episodes in the past 24 hrs: Medication Compliance: compliant  Vital Signs in the past 24 hrs: WNL with exception of slightly elevated HR of 102 earlier today morning. PRN Medications in the past 24 hrs: None  Patient assessment note: Mood remains depressed, patient denies SI/HI/AVH.  Denies paranoia, reports that depressive symptoms are slightly better as compared to yesterday, continues to report being significantly anxious, but states that she was unaware that she has hydroxyzine on a as needed basis, and that she can ask her nurse for it.  Patient educated on the need to ask for this medication whenever she is feeling anxious during the day or night.  Verbalizes understanding.  Patient reports an improved energy level today, states that her mood is slightly better today as compared to yesterday, reports appetite as slightly better today as compared to yesterday, reports that she slept well last night, states that she felt slightly drowsy earlier today morning, but drowsiness is resolving.  Reports that her mother presented to the hospital  yesterday for a visit, and does not like the fact that she is on medications.  Patient was able to verbalize that she knows that medications are in her benefit, and states that she will continue taking her medications with a goal of continuous improvement in her mental health.  Patient reports some jaw pain, which she thinks is related to her anxiety, as she states that she tends to clench her teeth whenever she is anxious.  She denies any other pain anywhere, reports that she has had a bowel movement yesterday.  We are continuing medications as listed below, with no changes today.  We will continue to monitor, and will continue to revisit discharge planning on a daily basis as patient continues to improve.  Total Time spent with patient: 45 minutes  Past Psychiatric History: See H & P  Past Medical History: History reviewed. No pertinent past medical history. History reviewed. No pertinent surgical history. Family History:  Family History  Problem Relation Age of Onset   Asthma Other    Family Psychiatric  History: See H & P Social History:  Social History   Substance and Sexual Activity  Alcohol Use No     Social History   Substance and Sexual Activity  Drug Use Not Currently   Types: Marijuana    Social History   Socioeconomic History   Marital status: Single    Spouse name: Not on file   Number of children: Not on file   Years of education: Not on file   Highest education level: Not on file  Occupational History   Not on file  Tobacco Use   Smoking status: Never   Smokeless tobacco: Never  Vaping Use  Vaping status: Every Day  Substance and Sexual Activity   Alcohol use: No   Drug use: Not Currently    Types: Marijuana   Sexual activity: Not Currently    Partners: Male  Other Topics Concern   Not on file  Social History Narrative   Not on file   Social Determinants of Health   Financial Resource Strain: Not on file  Food Insecurity: Food Insecurity Present  (04/04/2023)   Hunger Vital Sign    Worried About Running Out of Food in the Last Year: Sometimes true    Ran Out of Food in the Last Year: Sometimes true  Transportation Needs: No Transportation Needs (04/04/2023)   PRAPARE - Administrator, Civil Service (Medical): No    Lack of Transportation (Non-Medical): No  Physical Activity: Not on file  Stress: Not on file  Social Connections: Not on file  Sleep: Good  Appetite:  Fair  Current Medications: Current Facility-Administered Medications  Medication Dose Route Frequency Provider Last Rate Last Admin   acetaminophen (TYLENOL) tablet 650 mg  650 mg Oral Q6H PRN Sunday Corn, NP   650 mg at 04/04/23 0045   alum & mag hydroxide-simeth (MAALOX/MYLANTA) 200-200-20 MG/5ML suspension 30 mL  30 mL Oral Q4H PRN Sunday Corn, NP       diphenhydrAMINE (BENADRYL) capsule 50 mg  50 mg Oral TID PRN Sunday Corn, NP       Or   diphenhydrAMINE (BENADRYL) injection 50 mg  50 mg Intramuscular TID PRN Sunday Corn, NP       haloperidol (HALDOL) tablet 5 mg  5 mg Oral TID PRN Sunday Corn, NP       Or   haloperidol lactate (HALDOL) injection 5 mg  5 mg Intramuscular TID PRN Sunday Corn, NP       hydrOXYzine (ATARAX) tablet 25 mg  25 mg Oral TID PRN Sunday Corn, NP       LORazepam (ATIVAN) tablet 2 mg  2 mg Oral TID PRN Sunday Corn, NP       Or   LORazepam (ATIVAN) injection 2 mg  2 mg Intramuscular TID PRN Sunday Corn, NP       magnesium hydroxide (MILK OF MAGNESIA) suspension 30 mL  30 mL Oral Daily PRN Sunday Corn, NP       QUEtiapine (SEROQUEL XR) 24 hr tablet 50 mg  50 mg Oral QHS Sunday Corn, NP   50 mg at 04/04/23 2124   venlafaxine XR (EFFEXOR-XR) 24 hr capsule 75 mg  75 mg Oral Q breakfast Massengill, Harrold Donath, MD   75 mg at 04/05/23 1610    Lab Results:  Results for orders placed or performed during the hospital encounter of 04/03/23 (from the past 48 hour(s))  CBC with  Differential/Platelet     Status: Abnormal   Collection Time: 04/05/23  6:18 AM  Result Value Ref Range   WBC 3.9 (L) 4.0 - 10.5 K/uL   RBC 3.84 (L) 3.87 - 5.11 MIL/uL   Hemoglobin 11.7 (L) 12.0 - 15.0 g/dL   HCT 96.0 (L) 45.4 - 09.8 %   MCV 92.7 80.0 - 100.0 fL   MCH 30.5 26.0 - 34.0 pg   MCHC 32.9 30.0 - 36.0 g/dL   RDW 11.9 14.7 - 82.9 %   Platelets 361 150 - 400 K/uL   nRBC 0.0 0.0 - 0.2 %   Neutrophils Relative % 27 %  Neutro Abs 1.1 (L) 1.7 - 7.7 K/uL   Lymphocytes Relative 61 %   Lymphs Abs 2.4 0.7 - 4.0 K/uL   Monocytes Relative 9 %   Monocytes Absolute 0.3 0.1 - 1.0 K/uL   Eosinophils Relative 2 %   Eosinophils Absolute 0.1 0.0 - 0.5 K/uL   Basophils Relative 1 %   Basophils Absolute 0.0 0.0 - 0.1 K/uL   Immature Granulocytes 0 %   Abs Immature Granulocytes 0.01 0.00 - 0.07 K/uL    Comment: Performed at [facility], [address] [place], [place] [address]    Blood Alcohol level:  Lab Results  Component Value Date   ETH <10 04/03/2023    Metabolic Disorder Labs: Lab Results  Component Value Date   HGBA1C 5.2 04/03/2023   MPG 102.54 04/03/2023   Lab Results  Component Value Date   PROLACTIN 12.0 04/03/2023   Lab Results  Component Value Date   CHOL 225 (H) 04/03/2023   TRIG 44 04/03/2023   HDL 76 04/03/2023   CHOLHDL 3.0 04/03/2023   VLDL 9 04/03/2023   LDLCALC 140 (H) 04/03/2023    Physical Findings: AIMS:  , ,  ,  ,    CIWA:    COWS:     Musculoskeletal: Strength & Muscle Tone: within normal limits Gait & Station: normal Patient leans: N/A  Psychiatric Specialty Exam:  Presentation  General Appearance:  Appropriate for Environment  Eye Contact: Good  Speech: Clear and Coherent  Speech Volume: Normal  Handedness: Right   Mood and Affect  Mood: Anxious; Depressed  Affect: Congruent   Thought Process  Thought Processes: Coherent  Descriptions of Associations:Intact  Orientation:Full (Time,  Place and Person)  Thought Content:Logical  History of Schizophrenia/Schizoaffective disorder:No  Duration of Psychotic Symptoms:No data recorded Hallucinations:Hallucinations: None  Ideas of Reference:None  Suicidal Thoughts:Suicidal Thoughts: No SI Passive Intent and/or Plan: Without Intent; Without Plan  Homicidal Thoughts:Homicidal Thoughts: No   Sensorium  Memory: Immediate Good  Judgment: Fair  Insight: Fair   Art therapist  Concentration: Good  Attention Span: Good  Recall: Good  Fund of Knowledge: Good  Language: Good   Psychomotor Activity  Psychomotor Activity: Psychomotor Activity: Normal   Assets  Assets: Resilience; Social Support   Sleep  Sleep: Sleep: Good    Physical Exam: Physical Exam Constitutional:      Appearance: Normal appearance.  HENT:     Head: Normocephalic.     Nose: No congestion.  Eyes:     Pupils: Pupils are equal, round, and reactive to light.  Musculoskeletal:     Cervical back: Normal range of motion.  Neurological:     Mental Status: She is alert and oriented to person, place, and time.    Review of Systems  Constitutional:  Negative for fever.  HENT:  Negative for hearing loss.   Eyes:  Negative for blurred vision.  Respiratory:  Negative for cough.   Cardiovascular:  Negative for chest pain.  Gastrointestinal:  Negative for heartburn.  Genitourinary:  Negative for dysuria.  Musculoskeletal:  Negative for myalgias.  Skin:  Negative for rash.  Neurological:  Negative for dizziness.  Psychiatric/Behavioral:  Positive for depression and substance abuse. Negative for hallucinations, memory loss and suicidal ideas. The patient is nervous/anxious and has insomnia.    Blood pressure 109/79, pulse (!) 102, temperature 98.1 F (36.7 C), temperature source Oral, resp. rate 18, height 5\' 10"  (1.778 m), weight 51.7 kg, SpO2 100%. Body mass index is 16.36  kg/m.  Treatment Plan Summary: Daily  contact with patient to assess and evaluate symptoms and progress in treatment and Medication management  Safety and Monitoring: Voluntary admission to inpatient psychiatric unit for safety, stabilization and treatment Daily contact with patient to assess and evaluate symptoms and progress in treatment Patient's case to be discussed in multi-disciplinary team meeting Observation Level : q15 minute checks Vital signs: q12 hours Precautions: Safety  Long Term Goal(s): Improvement in symptoms so as ready for discharge  Short Term Goals: Ability to identify changes in lifestyle to reduce recurrence of condition will improve, Ability to verbalize feelings will improve, Ability to disclose and discuss suicidal ideas, Ability to demonstrate self-control will improve, Ability to identify and develop effective coping behaviors will improve, Ability to maintain clinical measurements within normal limits will improve, and Compliance with prescribed medications will improve  Diagnoses Principal Problem:   MDD (major depressive disorder), recurrent severe, without psychosis (HCC) Active Problems:   Suicidal ideation   GAD (generalized anxiety disorder)   PTSD (post-traumatic stress disorder)  Medications -Continue Effexor XR to 75 mg daily for depression & anxiety -Continue Seroquel XR 50 mg at bedtime -Continue Hjydroxyzine 25 mg TID PRN for anxiety -Continue Agitation Protocol: Haldol/Ativan/Benadryl (See MAR for details)  Other PRNS -Continue Tylenol 650 mg every 6 hours PRN for mild pain -Continue Maalox 30 mg every 4 hrs PRN for indigestion -Continue Milk of Magnesia as needed every 6 hrs for constipation  Labs Reviewed: Low Neutrophils on admission, and still low on repeat, WBCs improved with repeat. Will repeat PCP f/u after discharge.  Discharge Planning: Social work and case management to assist with discharge planning and identification of hospital follow-up needs prior to  discharge Estimated LOS: 5-7 days Discharge Concerns: Need to establish a safety plan; Medication compliance and effectiveness Discharge Goals: Return home with outpatient referrals for mental health follow-up including medication management/psychotherapy  I certify that inpatient services furnished can reasonably be expected to improve the patient's condition.    Starleen Blue, NP 7/26/202412:59 PM    Starleen Blue, NP 04/05/2023, 12:59 PM

## 2023-04-05 NOTE — Plan of Care (Signed)
  Problem: Activity: Goal: Interest or engagement in activities will improve Outcome: Progressing Goal: Sleeping patterns will improve Outcome: Progressing   Problem: Coping: Goal: Ability to verbalize frustrations and anger appropriately will improve Outcome: Progressing   

## 2023-04-05 NOTE — [facility] Group Notes (Signed)
[facility] Group Notes:  (Nursing/MHT/Case Management/Adjunct)  Date:  04/05/2023  Time:  10:50 PM  Type of Therapy:   AA Group  Participation Level:  Active  Participation Quality:  Appropriate  Affect:  Appropriate  Cognitive:  Appropriate  Insight:  Appropriate  Engagement in Group:  Supportive  Modes of Intervention:  Support  Summary of Progress/Problems: Pt attended AA meeting.   Charlotte Norman 04/05/2023, 10:50 PM

## 2023-04-05 NOTE — BH IP Treatment Plan (Signed)
Interdisciplinary Treatment and Diagnostic Plan Initial  04/05/2023 Time of Session: 11:00am Charlotte Norman MRN: [id number]  Principal Diagnosis: MDD (major depressive disorder), recurrent severe, without psychosis (HCC)  Secondary Diagnoses: Principal Problem:   MDD (major depressive disorder), recurrent severe, without psychosis (HCC) Active Problems:   Suicidal ideation   GAD (generalized anxiety disorder)   PTSD (post-traumatic stress disorder)   Current Medications:  Current Facility-Administered Medications  Medication Dose Route Frequency Provider Last Rate Last Admin   acetaminophen (TYLENOL) tablet 650 mg  650 mg Oral Q6H PRN Sunday Corn, NP   650 mg at 04/04/23 0045   alum & mag hydroxide-simeth (MAALOX/MYLANTA) 200-200-20 MG/5ML suspension 30 mL  30 mL Oral Q4H PRN Sunday Corn, NP       diphenhydrAMINE (BENADRYL) capsule 50 mg  50 mg Oral TID PRN Sunday Corn, NP       Or   diphenhydrAMINE (BENADRYL) injection 50 mg  50 mg Intramuscular TID PRN Sunday Corn, NP       haloperidol (HALDOL) tablet 5 mg  5 mg Oral TID PRN Sunday Corn, NP       Or   haloperidol lactate (HALDOL) injection 5 mg  5 mg Intramuscular TID PRN Sunday Corn, NP       hydrOXYzine (ATARAX) tablet 25 mg  25 mg Oral TID PRN Sunday Corn, NP       LORazepam (ATIVAN) tablet 2 mg  2 mg Oral TID PRN Sunday Corn, NP       Or   LORazepam (ATIVAN) injection 2 mg  2 mg Intramuscular TID PRN Sunday Corn, NP       magnesium hydroxide (MILK OF MAGNESIA) suspension 30 mL  30 mL Oral Daily PRN Sunday Corn, NP       QUEtiapine (SEROQUEL XR) 24 hr tablet 50 mg  50 mg Oral QHS Sunday Corn, NP   50 mg at 04/04/23 2124   venlafaxine XR (EFFEXOR-XR) 24 hr capsule 75 mg  75 mg Oral Q breakfast Massengill, Harrold Donath, MD   75 mg at 04/05/23 2440   PTA Medications: Medications Prior to Admission  Medication Sig Dispense Refill Last Dose   QUEtiapine  (SEROQUEL) 25 MG tablet Take 25 mg by mouth 2 (two) times daily.       Patient Stressors: Copy difficulties    Patient Strengths: Ability for insight  Active sense of humor  Communication skills  General fund of knowledge  Motivation for treatment/growth   Treatment Modalities: Medication Management, Group therapy, Case management,  1 to 1 session with clinician, Psychoeducation, Recreational therapy.   Physician Treatment Plan for Primary Diagnosis: MDD (major depressive disorder), recurrent severe, without psychosis (HCC) Long Term Goal(s): Improvement in symptoms so as ready for discharge   Short Term Goals: Ability to identify changes in lifestyle to reduce recurrence of condition will improve Ability to verbalize feelings will improve Ability to disclose and discuss suicidal ideas Ability to demonstrate self-control will improve Ability to identify and develop effective coping behaviors will improve Ability to identify triggers associated with substance abuse/mental health issues will improve  Medication Management: Evaluate patient's response, side effects, and tolerance of medication regimen.  Therapeutic Interventions: 1 to 1 sessions, Unit Group sessions and Medication administration.  Evaluation of Outcomes: Progressing  Physician Treatment Plan for Secondary Diagnosis: Principal Problem:   MDD (major depressive disorder), recurrent severe, without psychosis (HCC) Active Problems:   Suicidal ideation  GAD (generalized anxiety disorder)   PTSD (post-traumatic stress disorder)  Long Term Goal(s): Improvement in symptoms so as ready for discharge   Short Term Goals: Ability to identify changes in lifestyle to reduce recurrence of condition will improve Ability to verbalize feelings will improve Ability to disclose and discuss suicidal ideas Ability to demonstrate self-control will improve Ability to identify and develop effective coping  behaviors will improve Ability to identify triggers associated with substance abuse/mental health issues will improve     Medication Management: Evaluate patient's response, side effects, and tolerance of medication regimen.  Therapeutic Interventions: 1 to 1 sessions, Unit Group sessions and Medication administration.  Evaluation of Outcomes: Progressing   RN Treatment Plan for Primary Diagnosis: MDD (major depressive disorder), recurrent severe, without psychosis (HCC) Long Term Goal(s): Knowledge of disease and therapeutic regimen to maintain health will improve and    Short Term Goals: Ability to remain free from injury will improve, Ability to verbalize frustration and anger appropriately will improve, Ability to demonstrate self-control, Ability to participate in decision making will improve, Ability to verbalize feelings will improve, Ability to disclose and discuss suicidal ideas, Ability to identify and develop effective coping behaviors will improve, and Compliance with prescribed medications will improve  Medication Management: RN will administer medications as ordered by provider, will assess and evaluate patient's response and provide education to patient for prescribed medication. RN will report any adverse and/or side effects to prescribing provider.  Therapeutic Interventions: 1 on 1 counseling sessions, Psychoeducation, Medication administration, Evaluate responses to treatment, Monitor vital signs and CBGs as ordered, Perform/monitor CIWA, COWS, AIMS and Fall Risk screenings as ordered, Perform wound care treatments as ordered.  Evaluation of Outcomes: Progressing   LCSW Treatment Plan for Primary Diagnosis: MDD (major depressive disorder), recurrent severe, without psychosis (HCC) Long Term Goal(s): Safe transition to appropriate next level of care at discharge, Engage patient in therapeutic group addressing interpersonal concerns.  Short Term Goals: Engage patient in  aftercare planning with referrals and resources, Increase social support, Increase ability to appropriately verbalize feelings, Increase emotional regulation, Facilitate acceptance of mental health diagnosis and concerns, Facilitate patient progression through stages of change regarding substance use diagnoses and concerns, Identify triggers associated with mental health/substance abuse issues, and Increase skills for wellness and recovery  Therapeutic Interventions: Assess for all discharge needs, 1 to 1 time with Social worker, Explore available resources and support systems, Assess for adequacy in community support network, Educate family and significant other(s) on suicide prevention, Complete Psychosocial Assessment, Interpersonal group therapy.  Evaluation of Outcomes: Progressing   Progress in Treatment: Attending groups: Yes. Participating in groups: Yes. Taking medication as prescribed: Yes. Toleration medication: Yes. Family/Significant other contact made: No, will contact:  Contact will be made once permission is granted Patient understands diagnosis: Yes. Discussing patient identified problems/goals with staff: Yes. Medical problems stabilized or resolved: Yes. Denies suicidal/homicidal ideation: Yes. Issues/concerns per patient self-inventory: No. Other: none reported  New problem(s) identified: No, Describe:  none reported  New Short Term/Long Term Goal(s):): medication stabilization, elimination of SI thoughts, development of comprehensive mental wellness plan.     Patient Goals:  Coping skills, manage emotions  Discharge Plan or Barriers: : Patient recently admitted. CSW will continue to follow and assess for appropriate referrals and possible discharge planning.    Reason for Continuation of Hospitalization: Anxiety Depression Suicidal ideation  Estimated Length of Stay: 3-7 days  Last 3 [place] Suicide Severity Risk Score: Flowsheet Row Ad[facility] (Current) [facility]  [facility] Most recent reading at 04/04/2023 12:00 AM ED from 04/03/2023 in [facility] Most recent reading at 04/03/2023  6:02 PM  C-SSRS RISK CATEGORY Low Risk Low Risk       Last PHQ 2/9 Scores:    06/30/2019    2:55 PM 05/22/2018    4:13 PM 04/22/2018    8:39 AM  Depression screen PHQ 2/9  Decreased Interest 2 2 1   Down, Depressed, Hopeless 3 1 0  PHQ - 2 Score 5 3 1   Altered sleeping 3 3 3   Tired, decreased energy 3 3 3   Change in appetite 2 0 0  Feeling bad or failure about yourself  1 1 0  Trouble concentrating 2 0 0  Moving slowly or fidgety/restless 0 0 0  Suicidal thoughts 1 0 0  PHQ-9 Score 17 10 7     Scribe for Treatment Team: Charise Killian 04/05/2023 11:28 AM

## 2023-04-05 NOTE — Group Note (Signed)
Recreation Therapy Group Note   Group Topic:Team Building  Group Date: 04/05/2023 Start Time: 0930 End Time: 1000 Facilitators: Maren Wiesen-McCall, LRT,CTRS Location: [address] Dayroom   Goal Area(s) Addresses:  Patient will effectively work with peer towards shared goal.  Patient will identify skills used to make activity successful.  Patient will identify how skills used during activity can be applied to reach post d/c goals.   Group Description: [employer]. In teams of 5-6, patients were given 11 craft pipe cleaners. Using the materials provided, patients were instructed to compete again the opposing team(s) to build the tallest free-standing structure from floor level. The activity was timed; difficulty increased by Clinical research associate as Production designer, theatre/television/film continued.  Systematically resources were removed with additional directions for example, placing one arm behind their back, working in silence, and shape stipulations. LRT facilitated post-activity discussion reviewing team processes and necessary communication skills involved in completion. Patients were encouraged to reflect how the skills utilized, or not utilized, in this activity can be incorporated to positively impact support systems post discharge.   Affect/Mood: Appropriate   Participation Level: Engaged   Participation Quality: Independent   Behavior: Appropriate   Speech/Thought Process: Focused   Insight: Good   Judgement: Good   Modes of Intervention: STEM Activity   Patient Response to Interventions:  Engaged   Education Outcome:  Acknowledges education   Clinical Observations/Individualized Feedback: Pt was engaged and worked well with peers in sharing ideas to complete the activity. Pt was focused and determined to be successful in completing the tower. Pt and peers were able to accomplish the goal of the group.     Plan: Continue to engage patient in RT group sessions 2-3x/week.   Magie Ciampa-McCall, LRT,CTRS  04/05/2023 1:10 PM

## 2023-04-05 NOTE — [facility] Group Notes (Signed)
Adult Psychoeducational Group Note  Date:  04/05/2023 Time:  10:18 AM  Group Topic/Focus:  Goals Group:   The focus of this group is to help patients establish daily goals to achieve during treatment and discuss how the patient can incorporate goal setting into their daily lives to aide in recovery. Orientation:   The focus of this group is to educate the patient on the purpose and policies of crisis stabilization and provide a format to answer questions about their admission.  The group details unit policies and expectations of patients while admitted.  Participation Level:  Active  Participation Quality:  Appropriate  Affect:  Appropriate  Cognitive:  Appropriate  Insight: Appropriate  Engagement in Group:  Engaged  Modes of Intervention:  Discussion  Additional Comments:  Pt attended the goals group and remained appropriate and engaged throughout the duration of the group.   Sheran Lawless 04/05/2023, 10:18 AM

## 2023-04-05 NOTE — Progress Notes (Signed)
   04/04/23 2125  Psych Admission Type (Psych Patients Only)  Admission Status Voluntary  Psychosocial Assessment  Patient Complaints Depression  Eye Contact Fair  Facial Expression Flat  Affect Depressed  Speech Logical/coherent  Interaction Assertive  Motor Activity Other (Comment) (WDL)  Appearance/Hygiene Unremarkable  Behavior Characteristics Cooperative  Mood Depressed  Thought Process  Coherency WDL  Content WDL  Delusions None reported or observed  Perception WDL  Hallucination None reported or observed  Judgment Impaired  Confusion None  Danger to Self  Current suicidal ideation? Denies  Self-Injurious Behavior No self-injurious ideation or behavior indicators observed or expressed   Agreement Not to Harm Self Yes  Description of Agreement verbal  Danger to Others  Danger to Others None reported or observed   Pt was offered support and encouragement. Pt was given scheduled medications.Q 15 minute checks were done for safety. Pt attended group and interacts well with peers and staff. Pt has no complaints.Pt receptive to treatment and safety maintained on unit.

## 2023-04-05 NOTE — Progress Notes (Signed)
Patient identified her goal for today as " Work on coping skills develop discharge plan work toward going home". Patient rated her depression level as 2/10 and her anxiety level as 4/10 with 10 being the worst and 0 being none. Patient is medication compliant, no adverse reaction observed from prescribed medication. Minimal interaction observed with peers while out in the milieu. Patient rated her appetite and sleep as being good. Patient denies SI/HI and AVH. Safety maintained.  04/05/23 0815  Psych Admission Type (Psych Patients Only)  Admission Status Voluntary  Psychosocial Assessment  Patient Complaints Anxiety;Depression  Eye Contact Fair  Facial Expression Flat  Affect Anxious;Depressed  Speech Logical/coherent  Interaction Assertive  Motor Activity Other (Comment) (WNL)  Appearance/Hygiene Unremarkable  Behavior Characteristics Cooperative  Mood Depressed;Anxious  Thought Process  Coherency WDL  Content WDL  Delusions None reported or observed  Perception WDL  Hallucination None reported or observed  Judgment Impaired  Confusion None  Danger to Self  Current suicidal ideation? Denies  Self-Injurious Behavior No self-injurious ideation or behavior indicators observed or expressed   Agreement Not to Harm Self Yes  Description of Agreement Verbal  Danger to Others  Danger to Others None reported or observed

## 2023-04-05 NOTE — Progress Notes (Signed)
   04/05/23 2321  Psych Admission Type (Psych Patients Only)  Admission Status Voluntary  Psychosocial Assessment  Patient Complaints Anxiety;Depression  Eye Contact Fair  Facial Expression Flat;Sad  Affect Anxious;Depressed  Speech Logical/coherent  Interaction Assertive  Motor Activity Other (Comment) (WNL)  Appearance/Hygiene Unremarkable  Behavior Characteristics Cooperative  Mood Depressed;Anxious  Thought Process  Coherency WDL  Content WDL  Delusions None reported or observed  Perception WDL  Hallucination None reported or observed  Judgment Impaired  Confusion None  Danger to Self  Current suicidal ideation? Denies  Self-Injurious Behavior No self-injurious ideation or behavior indicators observed or expressed   Agreement Not to Harm Self Yes  Description of Agreement verbal  Danger to Others  Danger to Others None reported or observed

## 2023-04-05 NOTE — Plan of Care (Signed)
  Problem: Education: Goal: Knowledge of [facility] Education information/materials will improve Outcome: Progressing Goal: Emotional status will improve Outcome: Progressing Goal: Mental status will improve Outcome: Progressing Goal: Verbalization of understanding the information provided will improve Outcome: Progressing   Problem: Activity: Goal: Interest or engagement in activities will improve Outcome: Progressing Goal: Sleeping patterns will improve Outcome: Progressing   Problem: Coping: Goal: Ability to verbalize frustrations and anger appropriately will improve Outcome: Progressing Goal: Ability to demonstrate self-control will improve Outcome: Progressing   Problem: Health Behavior/Discharge Planning: Goal: Identification of resources available to assist in meeting health care needs will improve Outcome: Progressing Goal: Compliance with treatment plan for underlying cause of condition will improve Outcome: Progressing   Problem: Physical Regulation: Goal: Ability to maintain clinical measurements within normal limits will improve Outcome: Progressing   Problem: Safety: Goal: Periods of time without injury will increase Outcome: Progressing   Problem: Education: Goal: Ability to make informed decisions regarding treatment will improve Outcome: Progressing   Problem: Coping: Goal: Coping ability will improve Outcome: Progressing   Problem: Health Behavior/Discharge Planning: Goal: Identification of resources available to assist in meeting health care needs will improve Outcome: Progressing   Problem: Medication: Goal: Compliance with prescribed medication regimen will improve Outcome: Progressing   Problem: Self-Concept: Goal: Ability to disclose and discuss suicidal ideas will improve Outcome: Progressing Goal: Will verbalize positive feelings about self Outcome: Progressing   Problem: Education: Goal: Utilization of techniques to improve  thought processes will improve Outcome: Progressing Goal: Knowledge of the prescribed therapeutic regimen will improve Outcome: Progressing   Problem: Activity: Goal: Interest or engagement in leisure activities will improve Outcome: Progressing Goal: Imbalance in normal sleep/wake cycle will improve Outcome: Progressing   Problem: Coping: Goal: Coping ability will improve Outcome: Progressing Goal: Will verbalize feelings Outcome: Progressing   Problem: Health Behavior/Discharge Planning: Goal: Ability to make decisions will improve Outcome: Progressing Goal: Compliance with therapeutic regimen will improve Outcome: Progressing   Problem: Role Relationship: Goal: Will demonstrate positive changes in social behaviors and relationships Outcome: Progressing   Problem: Safety: Goal: Ability to disclose and discuss suicidal ideas will improve Outcome: Progressing Goal: Ability to identify and utilize support systems that promote safety will improve Outcome: Progressing   Problem: Self-Concept: Goal: Will verbalize positive feelings about self Outcome: Progressing Goal: Level of anxiety will decrease Outcome: Progressing   

## 2023-04-06 MED ORDER — WHITE PETROLATUM EX OINT
TOPICAL_OINTMENT | CUTANEOUS | Status: AC
  Administered 2023-04-06: 1
  Filled 2023-04-06: qty 5

## 2023-04-06 MED ORDER — VENLAFAXINE HCL ER 150 MG PO CP24
150.0000 mg | ORAL_CAPSULE | Freq: Every day | ORAL | Status: DC
  Administered 2023-04-07 – 2023-04-11 (×5): 150 mg via ORAL
  Filled 2023-04-06 (×7): qty 1

## 2023-04-06 NOTE — Progress Notes (Signed)
[facility] MD Progress Note  04/06/2023 2:08 PM Kissy Norman  MRN:  [id number]  Principal Problem: MDD (major depressive disorder), recurrent severe, without psychosis (HCC) Diagnosis: Principal Problem:   MDD (major depressive disorder), recurrent severe, without psychosis (HCC) Active Problems:   Suicidal ideation   GAD (generalized anxiety disorder)   PTSD (post-traumatic stress disorder)  Reason for Admission: Charlotte "Elease Etienne" Norman is a 22 yr old female who presented to [facility] on 7/24 with worsening depression and Passive SI. PPHx is significant for Depression, Anxiety, and PTSD, and 1 Suicide Attempt (OD-2022), and no history of Self Injurious Behavior or Prior Psychiatric Hospitalizations.   24 hr chart review: Sleep Hours last night: 8 hrs per nursing documentation. Slept well as per patient.  Nursing Concerns: none  Behavioral episodes in the past 24 hrs: Medication Compliance: compliant  Vital Signs in the past 24 hrs: WNL with exception of low HR of 49 earlier today morning, but was rechecked and was 90 after pt pushed fluids.  PRN Medications in the past 24 hrs: Hydroxyzine and Tylenol  Patient assessment note: Mood is less depressed as compared to yesterday, patient is also reporting that anxiety is better controlled today than it was yesterday. She denies SI/HI/AVH.  Continues to deny paranoia, continuing to report daily improvement in energy level, reports feeling optimistic about the future, reports some motivation today.  States that her older brother visited last night, and noticed a slight changed which was an improvement in her symptoms and actually commented about it.  Patient reports that sleep was good, appetite is still low but improving, reports that she had to push fluids earlier today morning due to heart rate being low, reports tolerating current medication regimen with no medication related side effects, reports that the hydroxyzine is helpful during  the day for anxiety, reports that she is moving her bowels well with the last one being yesterday.  Reports that she is making attempts to attend most unit group sessions, and found that the group sessions last night along with those today morning were helpful.  Patient reports wanting to talk to CSW regarding a partial hospitalization program after discharge, as she would like to attend more group sessions after discharge, with a goal to improve upon socialization skills, and she tends to isolate a lot at home.  Patient reports that she has found that attending group sessions here on the unit as well as speaking to peers has been helpful.  Patient and Clinical research associate talked about medications, rationales, benefits, and possible side effects, and also discussed titrating upwards Effexor to 150 mg starting tomorrow 7/28 with a goal of targeting depressive symptoms in hopes of improving her mental health.  Patient is agreeable to this plan, and tentative discharge date is either Tuesday, or Wednesday, based upon CSW being able to get patient into a partial hospitalization program prior to discharge and complete safety planning with patient's mother.  Continuous hospitalization remains necessary at this time, as patient is continuing to benefit from the unit group sessions, and we are continuing to titrate medications upwards to manage her mental health.  Total Time spent with patient: 45 minutes  Past Psychiatric History: See H & P  Past Medical History: History reviewed. No pertinent past medical history. History reviewed. No pertinent surgical history. Family History:  Family History  Problem Relation Age of Onset   Asthma Other    Family Psychiatric  History: See H & P Social History:  Social History   Substance and Sexual  Activity  Alcohol Use No     Social History   Substance and Sexual Activity  Drug Use Not Currently   Types: Marijuana    Social History   Socioeconomic History   Marital  status: Single    Spouse name: Not on file   Number of children: Not on file   Years of education: Not on file   Highest education level: Not on file  Occupational History   Not on file  Tobacco Use   Smoking status: Never   Smokeless tobacco: Never  Vaping Use   Vaping status: Every Day  Substance and Sexual Activity   Alcohol use: No   Drug use: Not Currently    Types: Marijuana   Sexual activity: Not Currently    Partners: Male  Other Topics Concern   Not on file  Social History Narrative   Not on file   Social Determinants of Health   Financial Resource Strain: Not on file  Food Insecurity: Food Insecurity Present (04/04/2023)   Hunger Vital Sign    Worried About Running Out of Food in the Last Year: Sometimes true    Ran Out of Food in the Last Year: Sometimes true  Transportation Needs: No Transportation Needs (04/04/2023)   PRAPARE - Administrator, Civil Service (Medical): No    Lack of Transportation (Non-Medical): No  Physical Activity: Not on file  Stress: Not on file  Social Connections: Not on file  Sleep: Good  Appetite:  Fair  Current Medications: Current Facility-Administered Medications  Medication Dose Route Frequency Provider Last Rate Last Admin   acetaminophen (TYLENOL) tablet 650 mg  650 mg Oral Q6H PRN Sunday Corn, NP   650 mg at 04/05/23 1721   alum & mag hydroxide-simeth (MAALOX/MYLANTA) 200-200-20 MG/5ML suspension 30 mL  30 mL Oral Q4H PRN Sunday Corn, NP       diphenhydrAMINE (BENADRYL) capsule 50 mg  50 mg Oral TID PRN Sunday Corn, NP       Or   diphenhydrAMINE (BENADRYL) injection 50 mg  50 mg Intramuscular TID PRN Sunday Corn, NP       haloperidol (HALDOL) tablet 5 mg  5 mg Oral TID PRN Sunday Corn, NP       Or   haloperidol lactate (HALDOL) injection 5 mg  5 mg Intramuscular TID PRN Sunday Corn, NP       hydrOXYzine (ATARAX) tablet 25 mg  25 mg Oral TID PRN Sunday Corn, NP   25 mg at  04/06/23 1343   LORazepam (ATIVAN) tablet 2 mg  2 mg Oral TID PRN Sunday Corn, NP       Or   LORazepam (ATIVAN) injection 2 mg  2 mg Intramuscular TID PRN Sunday Corn, NP       magnesium hydroxide (MILK OF MAGNESIA) suspension 30 mL  30 mL Oral Daily PRN Sunday Corn, NP       QUEtiapine (SEROQUEL XR) 24 hr tablet 50 mg  50 mg Oral QHS Sunday Corn, NP   50 mg at 04/05/23 2113   [START ON 04/07/2023] venlafaxine XR (EFFEXOR-XR) 24 hr capsule 150 mg  150 mg Oral Q breakfast Starleen Blue, NP        Lab Results:  Results for orders placed or performed during the hospital encounter of 04/03/23 (from the past 48 hour(s))  CBC with Differential/Platelet     Status: Abnormal   Collection Time: 04/05/23  6:18 AM  Result Value Ref Range   WBC 3.9 (L) 4.0 - 10.5 K/uL   RBC 3.84 (L) 3.87 - 5.11 MIL/uL   Hemoglobin 11.7 (L) 12.0 - 15.0 g/dL   HCT 19.1 (L) 47.8 - 29.5 %   MCV 92.7 80.0 - 100.0 fL   MCH 30.5 26.0 - 34.0 pg   MCHC 32.9 30.0 - 36.0 g/dL   RDW 62.1 30.8 - 65.7 %   Platelets 361 150 - 400 K/uL   nRBC 0.0 0.0 - 0.2 %   Neutrophils Relative % 27 %   Neutro Abs 1.1 (L) 1.7 - 7.7 K/uL   Lymphocytes Relative 61 %   Lymphs Abs 2.4 0.7 - 4.0 K/uL   Monocytes Relative 9 %   Monocytes Absolute 0.3 0.1 - 1.0 K/uL   Eosinophils Relative 2 %   Eosinophils Absolute 0.1 0.0 - 0.5 K/uL   Basophils Relative 1 %   Basophils Absolute 0.0 0.0 - 0.1 K/uL   Immature Granulocytes 0 %   Abs Immature Granulocytes 0.01 0.00 - 0.07 K/uL    Comment: Performed at [facility], [address] [place], [place] [address]    Blood Alcohol level:  Lab Results  Component Value Date   ETH <10 04/03/2023    Metabolic Disorder Labs: Lab Results  Component Value Date   HGBA1C 5.2 04/03/2023   MPG 102.54 04/03/2023   Lab Results  Component Value Date   PROLACTIN 12.0 04/03/2023   Lab Results  Component Value Date   CHOL 225 (H) 04/03/2023   TRIG 44  04/03/2023   HDL 76 04/03/2023   CHOLHDL 3.0 04/03/2023   VLDL 9 04/03/2023   LDLCALC 140 (H) 04/03/2023    Physical Findings: AIMS:  , ,  ,  ,    CIWA:    COWS:     Musculoskeletal: Strength & Muscle Tone: within normal limits Gait & Station: normal Patient leans: N/A  Psychiatric Specialty Exam:  Presentation  General Appearance:  Appropriate for Environment  Eye Contact: Fair  Speech: Clear and Coherent  Speech Volume: Normal  Handedness: Right   Mood and Affect  Mood: Depressed; Anxious  Affect: Congruent   Thought Process  Thought Processes: Coherent  Descriptions of Associations:Intact  Orientation:Full (Time, Place and Person)  Thought Content:Logical  History of Schizophrenia/Schizoaffective disorder:No  Duration of Psychotic Symptoms:No data recorded Hallucinations:Hallucinations: None  Ideas of Reference:None  Suicidal Thoughts:Suicidal Thoughts: No  Homicidal Thoughts:Homicidal Thoughts: No   Sensorium  Memory: Immediate Good  Judgment: Fair  Insight: Fair   Executive Functions  Concentration: Good  Attention Span: Good  Recall: Good  Fund of Knowledge: Good  Language: Good   Psychomotor Activity  Psychomotor Activity: Psychomotor Activity: Normal   Assets  Assets: Communication Skills; Social Support; Housing; Resilience; Desire for Improvement   Sleep  Sleep: Sleep: Fair    Physical Exam: Physical Exam Constitutional:      Appearance: Normal appearance.  HENT:     Head: Normocephalic.     Nose: No congestion.  Eyes:     Pupils: Pupils are equal, round, and reactive to light.  Musculoskeletal:     Cervical back: Normal range of motion.  Neurological:     Mental Status: She is alert and oriented to person, place, and time.    Review of Systems  Constitutional:  Negative for fever.  HENT:  Negative for hearing loss.   Eyes:  Negative for blurred vision.  Respiratory:  Negative  for  cough.   Cardiovascular:  Negative for chest pain.  Gastrointestinal:  Negative for heartburn.  Genitourinary:  Negative for dysuria.  Musculoskeletal:  Negative for myalgias.  Skin:  Negative for rash.  Neurological:  Negative for dizziness.  Psychiatric/Behavioral:  Positive for depression and substance abuse. Negative for hallucinations, memory loss and suicidal ideas. The patient is nervous/anxious and has insomnia.    Blood pressure (!) 125/90, pulse 90, temperature 98.5 F (36.9 C), temperature source Oral, resp. rate 14, height 5\' 10"  (1.778 m), weight 51.7 kg, SpO2 100%. Body mass index is 16.36 kg/m.  Treatment Plan Summary: Daily contact with patient to assess and evaluate symptoms and progress in treatment and Medication management  Safety and Monitoring: Voluntary admission to inpatient psychiatric unit for safety, stabilization and treatment Daily contact with patient to assess and evaluate symptoms and progress in treatment Patient's case to be discussed in multi-disciplinary team meeting Observation Level : q15 minute checks Vital signs: q12 hours Precautions: Safety  Long Term Goal(s): Improvement in symptoms so as ready for discharge  Short Term Goals: Ability to identify changes in lifestyle to reduce recurrence of condition will improve, Ability to verbalize feelings will improve, Ability to disclose and discuss suicidal ideas, Ability to demonstrate self-control will improve, Ability to identify and develop effective coping behaviors will improve, Ability to maintain clinical measurements within normal limits will improve, and Compliance with prescribed medications will improve  Diagnoses Principal Problem:   MDD (major depressive disorder), recurrent severe, without psychosis (HCC) Active Problems:   Suicidal ideation   GAD (generalized anxiety disorder)   PTSD (post-traumatic stress disorder)  Medications -Increase Effexor XR from 75 mg to 150 mg daily  for depression & anxiety  -Continue Seroquel XR 50 mg at bedtime -Continue Hydroxyzine 25 mg TID PRN for anxiety -Continue Agitation Protocol: Haldol/Ativan/Benadryl (See MAR for details)  Other PRNS -Continue Tylenol 650 mg every 6 hours PRN for mild pain -Continue Maalox 30 mg every 4 hrs PRN for indigestion -Continue Milk of Magnesia as needed every 6 hrs for constipation  Labs Reviewed: Low Neutrophils on admission, and still low on repeat, WBCs improved with repeat. Repeat CBC with diff as well as Lipid panel & Vitamin D levels ordered.  Discharge Planning: Social work and case management to assist with discharge planning and identification of hospital follow-up needs prior to discharge Estimated LOS: 5-7 days Discharge Concerns: Need to establish a safety plan; Medication compliance and effectiveness Discharge Goals: Return home with outpatient referrals for mental health follow-up including medication management/psychotherapy  I certify that inpatient services furnished can reasonably be expected to improve the patient's condition.    Starleen Blue, NP 7/27/20242:08 PM    Starleen Blue, NP 04/06/2023, 2:08 PM Patient ID: Charlotte Norman, female   DOB: November 10, 2000, 22 y.o.   MRN: [id number]

## 2023-04-06 NOTE — Progress Notes (Signed)
Pt is awake and alert x 4.  Flat affect and depressed mood. Pt does have good eye contact and answers questions appropriately.  Pt denies SI, HI or AVH.  She has taken medication without prompting and attended groups.  Q88min checks in place for safety.

## 2023-04-06 NOTE — [facility] Group Notes (Signed)
[facility] Group Notes:  (Nursing/MHT/Case Management/Adjunct)  Date:  04/06/2023  Time:  10:23 AM  Type of Therapy:  Psychoeducational Skills  Participation Level:  Active  Participation Quality:  Appropriate  Affect:  Appropriate  Cognitive:  Appropriate  Insight:  Appropriate  Engagement in Group:  Engaged  Modes of Intervention:  Discussion, Education, and Exploration  Summary of Progress/Problems: Daily Goals group and Community meeting held. Discussed with patients unit flow and ward rules. Discussed daily check in to rate how pts were feeling. Pt shared and participated.   Malva Limes 04/06/2023, 10:23 AM

## 2023-04-06 NOTE — [facility] Group Notes (Signed)
[facility] Group Notes:  (Nursing/MHT/Case Management/Adjunct)  Date:  04/06/2023  Time:  2:13 PM  Type of Therapy:  Psychoeducational Skills  Participation Level:  Active  Participation Quality:  Appropriate  Affect:  Appropriate  Cognitive:  Alert and Appropriate  Insight:  Appropriate  Engagement in Group:  Engaged  Modes of Intervention:  Discussion, Education, and Exploration  Summary of Progress/Problems: Pts were given education on anxiety, and a podcast with mental health professional was played which discussed identifying negative behavioral patterns and anxiety. Pt attended and was appropriate.   Charlotte Norman 04/06/2023, 2:13 PM

## 2023-04-06 NOTE — [facility] Group Notes (Signed)
[facility] Group Notes:  (Nursing/MHT/Case Management/Adjunct)  Date:  04/06/2023  Time:  9:38 PM  Type of Therapy:   Wrap-up group  Participation Level:  Active  Participation Quality:  Appropriate  Affect:  Appropriate  Cognitive:  Appropriate  Insight:  Appropriate  Engagement in Group:  Engaged  Modes of Intervention:  Education  Summary of Progress/Problems: Pt goal gather resources to make transition smoothly. Day 7/10.  Charlotte Norman 04/06/2023, 9:38 PM

## 2023-04-06 NOTE — Progress Notes (Signed)
Pt currently complaining of anxiety.  Rates 6  out of scale of 1-10.  Pt given prn medication as ordered.   Pt denies any other problems or issues. Continues to be visible in group room and interacting appropriately with peers.

## 2023-04-06 NOTE — Plan of Care (Signed)

## 2023-04-06 NOTE — Plan of Care (Signed)
  Problem: Activity: Goal: Interest or engagement in activities will improve Outcome: Progressing   Problem: Coping: Goal: Ability to verbalize frustrations and anger appropriately will improve Outcome: Progressing   

## 2023-04-07 LAB — LIPID PANEL
Cholesterol: 202 mg/dL — ABNORMAL HIGH (ref 0–200)
HDL: 72 mg/dL (ref >40)
LDL Cholesterol: 122 mg/dL — ABNORMAL HIGH (ref 0–99)
Total CHOL/HDL Ratio: 2.8 ratio
Triglycerides: 38 mg/dL (ref <150)
VLDL: 8 mg/dL (ref 0–40)

## 2023-04-07 LAB — CBC WITH DIFFERENTIAL/PLATELET
Abs Immature Granulocytes: 0.01 10*3/uL (ref 0.00–0.07)
Basophils Absolute: 0 10*3/uL (ref 0.0–0.1)
Basophils Relative: 1 %
Eosinophils Absolute: 0 10*3/uL (ref 0.0–0.5)
Eosinophils Relative: 1 %
HCT: 36.8 % (ref 36.0–46.0)
Hemoglobin: 11.9 g/dL — ABNORMAL LOW (ref 12.0–15.0)
Immature Granulocytes: 0 %
Lymphocytes Relative: 61 %
Lymphs Abs: 2.3 10*3/uL (ref 0.7–4.0)
MCH: 30.1 pg (ref 26.0–34.0)
MCHC: 32.3 g/dL (ref 30.0–36.0)
MCV: 92.9 fL (ref 80.0–100.0)
Monocytes Absolute: 0.4 10*3/uL (ref 0.1–1.0)
Monocytes Relative: 11 %
Neutro Abs: 1 10*3/uL — ABNORMAL LOW (ref 1.7–7.7)
Neutrophils Relative %: 26 %
Platelets: 234 10*3/uL (ref 150–400)
RBC: 3.96 MIL/uL (ref 3.87–5.11)
RDW: 12.1 % (ref 11.5–15.5)
WBC: 3.7 10*3/uL — ABNORMAL LOW (ref 4.0–10.5)
nRBC: 0 % (ref 0.0–0.2)

## 2023-04-07 LAB — VITAMIN D 25 HYDROXY (VIT D DEFICIENCY, FRACTURES): Vit D, 25-Hydroxy: 11.71 ng/mL — ABNORMAL LOW (ref 30–100)

## 2023-04-07 NOTE — Progress Notes (Signed)
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  Patient was assessed by Karel Jarvis, PA-C for reevaluation.  Patient is currently being managed on the following psychiatric medications:  Hydroxyzine 25 mg 3 times daily as needed Seroquel XR 24-hour tablet 50 mg at bedtime Venlafaxine XR 150 mg daily  Agitation protocol -Diphenhydramine 50 mg p.o. or injection, 3 times daily as needed for agitation  -Haloperidol 5 mg p.o. or injection, 3 times daily as needed for agitation  -Lorazepam 2 mg p.o. or injection, 3 times daily as needed for agitation  Patient reports that she is doing good and tolerating her medications well.  Patient also reports that she is sleeping well.  Since being hospitalized at this facility, patient reports that she has been talking and smiling more.  She reports that she feels more motivated and confident in her mood has been lifted since being admitted to this facility.  Patient endorses some depression she rates at 2 out of 10 with 10 being most severe.  She also endorses some anxiety she rates a 4 out of 10.  Patient's depressive symptoms and anxiety appear to be attributed to environmental and situational factors outside of the facility that are still bothering her.  Patient would like to speak with social work to address these issues prior to discharge.  Patient is alert and oriented x 4, calm, cooperative, and fully engaged in conversation during the encounter.  Patient speech is clear and coherent with normal rate.  Patient maintains good eye contact.  Patient's  thought content is logical.  Patient's thought process is coherent.  Patient exhibits minimal depression and anxiety with appropriate affect.  Patient denies suicidal or homicidal ideations.  She further denies auditory or visual hallucinations and does not appear to be responding to internal/external stimuli.  Patient denies paranoia or delusional thoughts.  Patient endorses good sleep and receives on average and 1/2 hours of sleep per night.  Patient endorses good appetite and eats on average 3 meals per day.  Principal Problem: MDD (major depressive disorder), recurrent severe, without psychosis (HCC) Diagnosis: Principal Problem:   MDD (major depressive disorder), recurrent severe, without psychosis (HCC) Active Problems:   Suicidal ideation   GAD (generalized anxiety disorder)   PTSD (post-traumatic stress disorder)  Total Time spent with patient: 25 minutes  Past Psychiatric History:  See H&P  Past Medical History: History reviewed. No pertinent past medical history. History reviewed. No pertinent surgical history. Family History:  Family History  Problem Relation Age of Onset   Asthma Other    Family Psychiatric  History:  See H&P  Social History:  Social History   Substance and Sexual Activity  Alcohol Use No     Social History   Substance and Sexual Activity  Drug Use Not Currently   Types: Marijuana    Social History   Socioeconomic History   Marital status: Single    Spouse name: Not on file   Number of children: Not on file   Years of education: Not on file   Highest education level: Not on file  Occupational History   Not on file  Tobacco Use   Smoking status: Never  Smokeless tobacco: Never  Vaping Use   Vaping status: Every Day  Substance and Sexual Activity   Alcohol use: No   Drug use: Not Currently    Types: Marijuana   Sexual activity: Not Currently    Partners: Male  Other Topics Concern   Not on file  Social History Narrative   Not on  file   Social Determinants of Health   Financial Resource Strain: Not on file  Food Insecurity: Food Insecurity Present (04/04/2023)   Hunger Vital Sign    Worried About Running Out of Food in the Last Year: Sometimes true    Ran Out of Food in the Last Year: Sometimes true  Transportation Needs: No Transportation Needs (04/04/2023)   PRAPARE - Administrator, Civil Service (Medical): No    Lack of Transportation (Non-Medical): No  Physical Activity: Not on file  Stress: Not on file  Social Connections: Not on file   Additional Social History:  See H&P  Sleep: Good  Appetite:  Good  Current Medications: Current Facility-Administered Medications  Medication Dose Route Frequency Provider Last Rate Last Admin   acetaminophen (TYLENOL) tablet 650 mg  650 mg Oral Q6H PRN Sunday Corn, NP   650 mg at 04/05/23 1721   alum & mag hydroxide-simeth (MAALOX/MYLANTA) 200-200-20 MG/5ML suspension 30 mL  30 mL Oral Q4H PRN Sunday Corn, NP       diphenhydrAMINE (BENADRYL) capsule 50 mg  50 mg Oral TID PRN Sunday Corn, NP       Or   diphenhydrAMINE (BENADRYL) injection 50 mg  50 mg Intramuscular TID PRN Sunday Corn, NP       haloperidol (HALDOL) tablet 5 mg  5 mg Oral TID PRN Sunday Corn, NP       Or   haloperidol lactate (HALDOL) injection 5 mg  5 mg Intramuscular TID PRN Sunday Corn, NP       hydrOXYzine (ATARAX) tablet 25 mg  25 mg Oral TID PRN Sunday Corn, NP   25 mg at 04/06/23 1343   LORazepam (ATIVAN) tablet 2 mg  2 mg Oral TID PRN Sunday Corn, NP       Or   LORazepam (ATIVAN) injection 2 mg  2 mg Intramuscular TID PRN Sunday Corn, NP       magnesium hydroxide (MILK OF MAGNESIA) suspension 30 mL  30 mL Oral Daily PRN Sunday Corn, NP       QUEtiapine (SEROQUEL XR) 24 hr tablet 50 mg  50 mg Oral QHS Sunday Corn, NP   50 mg at 04/06/23 2008   venlafaxine XR (EFFEXOR-XR) 24 hr capsule 150 mg  150 mg Oral Q breakfast  Starleen Blue, NP   150 mg at 04/07/23 1610    Lab Results:  Results for orders placed or performed during the hospital encounter of 04/03/23 (from the past 48 hour(s))  Lipid panel     Status: Abnormal   Collection Time: 04/07/23  6:30 AM  Result Value Ref Range   Cholesterol 202 (H) 0 - 200 mg/dL   Triglycerides 38 <960 mg/dL   HDL 72 >45 mg/dL   Total CHOL/HDL Ratio 2.8 RATIO   VLDL 8 0 - 40 mg/dL   LDL Cholesterol 409 (H) 0 - 99 mg/dL    Comment:        Total Cholesterol/HDL:CHD Risk Coronary Heart Disease Risk Table  Men   Women  1/2 Average Risk   3.4   3.3  Average Risk       5.0   4.4  2 X Average Risk   9.6   7.1  3 X Average Risk  23.4   11.0        Use the calculated Patient Ratio above and the CHD Risk Table to determine the patient's CHD Risk.        ATP III CLASSIFICATION (LDL):  <100     mg/dL   Optimal  161-096  mg/dL   Near or Above                    Optimal  130-159  mg/dL   Borderline  045-409  mg/dL   High  >811     mg/dL   Very High Performed at [facility], [address] [place], [place] [address]   CBC with Differential/Platelet     Status: Abnormal   Collection Time: 04/07/23  6:30 AM  Result Value Ref Range   WBC 3.7 (L) 4.0 - 10.5 K/uL   RBC 3.96 3.87 - 5.11 MIL/uL   Hemoglobin 11.9 (L) 12.0 - 15.0 g/dL   HCT 29.5 62.1 - 30.8 %   MCV 92.9 80.0 - 100.0 fL   MCH 30.1 26.0 - 34.0 pg   MCHC 32.3 30.0 - 36.0 g/dL   RDW 65.7 84.6 - 96.2 %   Platelets 234 150 - 400 K/uL   nRBC 0.0 0.0 - 0.2 %   Neutrophils Relative % 26 %   Neutro Abs 1.0 (L) 1.7 - 7.7 K/uL   Lymphocytes Relative 61 %   Lymphs Abs 2.3 0.7 - 4.0 K/uL   Monocytes Relative 11 %   Monocytes Absolute 0.4 0.1 - 1.0 K/uL   Eosinophils Relative 1 %   Eosinophils Absolute 0.0 0.0 - 0.5 K/uL   Basophils Relative 1 %   Basophils Absolute 0.0 0.0 - 0.1 K/uL   Immature Granulocytes 0 %   Abs Immature Granulocytes 0.01 0.00 - 0.07 K/uL     Comment: Performed at [facility], [address] [place], [place] [address]  VITAMIN D 25 Hydroxy (Vit-D Deficiency, Fractures)     Status: Abnormal   Collection Time: 04/07/23  6:30 AM  Result Value Ref Range   Vit D, 25-Hydroxy 11.71 (L) 30 - 100 ng/mL    Comment: (NOTE) Vitamin D deficiency has been defined by the Institute of Medicine  and an Endocrine Society practice guideline as a level of serum 25-OH  vitamin D less than 20 ng/mL (1,2). The Endocrine Society went on to  further define vitamin D insufficiency as a level between 21 and 29  ng/mL (2).  1. IOM (Institute of Medicine). 2010. Dietary reference intakes for  calcium and D. [place]: [employer]. 2. Holick MF, Binkley [place], Bischoff-Ferrari HA, et al. Evaluation,  treatment, and prevention of vitamin D deficiency: an Endocrine  Society clinical practice guideline, JCEM. 2011 Jul; 96(7): 1911-30.  Performed at [facility], [address] [place], [place] [address]     Blood Alcohol level:  Lab Results  Component Value Date   [facility] <10 04/03/2023    Metabolic Disorder Labs: Lab Results  Component Value Date   HGBA1C 5.2 04/03/2023   MPG 102.54 04/03/2023   Lab Results  Component Value Date   PROLACTIN 12.0 04/03/2023   Lab Results  Component Value  Date   CHOL 202 (H) 04/07/2023   TRIG 38 04/07/2023   HDL 72 04/07/2023   CHOLHDL 2.8 04/07/2023   VLDL 8 04/07/2023   LDLCALC 122 (H) 04/07/2023   LDLCALC 140 (H) 04/03/2023    Physical Findings: AIMS:  , ,  ,  ,    CIWA:    COWS:     Musculoskeletal: Strength & Muscle Tone: within normal limits Gait & Station: normal Patient leans: N/A  Psychiatric Specialty Exam:  Presentation  General Appearance:  Appropriate for Environment  Eye Contact: Good  Speech: Clear and Coherent; Normal Rate  Speech Volume: Normal  Handedness: Right   Mood and Affect  Mood: Anxious; Depressed  (Improving)  Affect: Congruent   Thought Process  Thought Processes: Coherent; Goal Directed  Descriptions of Associations:Intact  Orientation:Full (Time, Place and Person)  Thought Content:Logical  History of Schizophrenia/Schizoaffective disorder:No  Duration of Psychotic Symptoms:No data recorded Hallucinations:Hallucinations: None  Ideas of Reference:None  Suicidal Thoughts:Suicidal Thoughts: No  Homicidal Thoughts:Homicidal Thoughts: No   Sensorium  Memory: Immediate Good  Judgment: Fair  Insight: Fair   Executive Functions  Concentration: Good  Attention Span: Good  Recall: Good  Fund of Knowledge: Good  Language: Good   Psychomotor Activity  Psychomotor Activity: Psychomotor Activity: Normal   Assets  Assets: Communication Skills; Social Support; Housing; Resilience; Desire for Improvement   Sleep  Sleep: Sleep: Good Number of Hours of Sleep: 7.5    Physical Exam: Physical Exam Constitutional:      Appearance: Normal appearance.  HENT:     Head: Normocephalic and atraumatic.     Nose: Nose normal.     Mouth/Throat:     Mouth: Mucous membranes are moist.  Eyes:     Extraocular Movements: Extraocular movements intact.  Cardiovascular:     Rate and Rhythm: Normal rate and regular rhythm.  Pulmonary:     Effort: Pulmonary effort is normal.  Abdominal:     General: Abdomen is flat.  Musculoskeletal:        General: Normal range of motion.     Cervical back: Normal range of motion.  Skin:    General: Skin is warm and dry.  Neurological:     Mental Status: She is alert and oriented to person, place, and time.  Psychiatric:        Attention and Perception: Attention normal. She does not perceive auditory or visual hallucinations.        Mood and Affect: Mood is anxious and depressed.        Speech: Speech normal.        Behavior: Behavior normal. Behavior is cooperative.        Thought Content: Thought content  normal. Thought content is not paranoid or delusional. Thought content does not include homicidal or suicidal ideation. Thought content does not include suicidal plan.        Cognition and Memory: Cognition and memory normal.        Judgment: Judgment normal.   Review of Systems  Constitutional: Negative.   HENT: Negative.    Eyes: Negative.   Respiratory: Negative.    Cardiovascular: Negative.   Gastrointestinal: Negative.   Musculoskeletal: Negative.   Skin: Negative.   Neurological: Negative.   Psychiatric/Behavioral:  Positive for depression. Negative for hallucinations, substance abuse and suicidal ideas. The patient is nervous/anxious. The patient does not have insomnia.    Blood pressure 118/76, pulse 77, temperature 97.8 F (36.6 C), temperature source Oral, resp. rate 18, height  5\' 10"  (1.778 m), weight 51.7 kg, SpO2 92%. Body mass index is 16.36 kg/m.   Treatment Plan Summary: Daily contact with patient to assess and evaluate symptoms and progress in treatment and Medication management  Plan: Patient endorses improved mood during her evaluation.  Although patient continues to endorse some depression and anxiety, she reports that her symptoms have greatly improved since being admitted to this facility.  Patient lingering depressive symptoms and anxiety appear to be attributed to environmental and situational factors outside of the facility that she is still struggling with.  Prior to discharge from this facility, patient request to speak to social work in regards to addressing these factors so that she will be in a better standing following discharge from this facility.  Patient is tolerating medications well and will continue taking medications as prescribed.  Patient's venlafaxine XR dosage was adjusted today from 75 mg to 150 mg daily.  Safety and Monitoring: Voluntary admission to inpatient psychiatric unit for safety, stabilization and treatment Daily contact with patient  to assess and evaluate symptoms and progress in treatment Patient's case to be discussed in multi-disciplinary team meeting Observation Level : q15 minute checks Vital signs: q12 hours Precautions: Safety  Principal Problem: MDD (major depressive disorder), recurrent severe, without psychosis (HCC) Diagnosis: Principal Problem:   MDD (major depressive disorder), recurrent severe, without psychosis (HCC) Active Problems:   Suicidal ideation   GAD (generalized anxiety disorder)   PTSD (post-traumatic stress disorder)  #Major depressive disorder, recurrent severe, without psychosis -Continue venlafaxine XR (Effexor XR) 150 mg daily for depression  Agitation protocol -Diphenhydramine 50 mg p.o. or injection, 3 times daily as needed for agitation -Haloperidol 5 mg p.o. or injection, 3 times daily as needed for agitation  -Lorazepam 2 mg p.o. or injection, 3 times daily as needed for agitation  #Generalized anxiety disorder -Continue venlafaxine XR 150 mg daily for the management of her generalized anxiety disorder  #PTSD -Continue venlafaxine XR 150 mg daily for the management of her PTSD  As needed medications: -Patient to continue taking Tylenol 650 mg every 6 hours as needed for mild pain -Patient to continue taking Maalox/Mylanta 30 mL every 4 hours as needed for indigestion -Patient to continue taking Milk of Magnesia 30 mL as needed for mild constipation -Hydroxyzine 25 mg 3 times daily as needed for anxiety  Labs reviewed: Lipid panel significant for elevated cholesterol (202 mg/dL) and elevated LDL cholesterol (122 mg/dL).  Complete blood count with differential significant for decreased white blood cell count (3.7 K/uL), decreased hemoglobin (11.9 g/dL), and decreased absolute neutrophil count (1.0 K/uL).  Patient was also found to have a decreased vitamin D level (11.71 ng/mL).  Discharge Planning: Social work and case management to assist with discharge planning and  identification of hospital follow-up needs prior to discharge Estimated LOS: 5-7 days Discharge Concerns: Need to establish a safety plan; Medication compliance and effectiveness Discharge Goals: Return home with outpatient referrals for mental health follow-up including medication management/psychotherapy   I certify that inpatient services furnished can reasonably be expected to improve the patient's condition.    Meta Hatchet, PA 04/07/2023, 1:46 PM

## 2023-04-07 NOTE — Progress Notes (Signed)
   04/07/23 0949  Psych Admission Type (Psych Patients Only)  Admission Status Voluntary  Psychosocial Assessment  Patient Complaints Anxiety;Appetite increase  Eye Contact Fair  Facial Expression Anxious  Affect Anxious  Speech Logical/coherent  Interaction Assertive  Motor Activity Fidgety  Appearance/Hygiene Unremarkable  Behavior Characteristics Appropriate to situation  Mood Anxious  Thought Process  Coherency WDL  Content WDL  Delusions None reported or observed  Perception WDL  Hallucination None reported or observed  Judgment WDL  Confusion WDL  Danger to Self  Current suicidal ideation? Denies  Self-Injurious Behavior No self-injurious ideation or behavior indicators observed or expressed   Agreement Not to Harm Self Yes  Description of Agreement verbal  Danger to Others  Danger to Others None reported or observed

## 2023-04-07 NOTE — [facility] Group Notes (Signed)
[facility] Group Notes:  (Nursing/MHT/Case Management/Adjunct)  Date:  04/07/2023  Time:  10:03 PM  Type of Therapy:  Wrap Up Group  Participation Level:  Active  Participation Quality:  Appropriate  Affect:  Appropriate  Cognitive:  Appropriate  Insight:  Appropriate  Engagement in Group:  Improving  Modes of Intervention:  Activity  Summary of Progress/Problems: Pt said she had a good day  Teyona Nichelson E Wrenna Saks 04/07/2023, 10:03 PM

## 2023-04-07 NOTE — [facility] Group Notes (Signed)
04/07/2023 11:00am  Type of Group and Topic: Psychoeducational Group:  Wellness  Participation Level:  Active  Description of Group  Wellness group introduces the topic and its focus on developing healthy habits across the spectrum and its relationship to a decrease in hospital admissions.  Six areas of wellness are discussed: physical, social spiritual, intellectual, occupational, and emotional.  Patients are asked to consider their current wellness habits and to identify areas of wellness where they are interested and able to focus on improvements.    Therapeutic Goals Patients will understand components of wellness and how they can positively impact overall health.  Patients will identify areas of wellness where they have developed good habits. Patients will identify areas of wellness where they would like to make improvements.    Summary of Patient Progress: Pt active and attentive during group discussion.  Was called out by provider at one point but did return.  Pt identified intellectual and emotional as wellness areas of strength.  Pt identified spiritual and environmental as wellness areas that need work.       Therapeutic Modalities: Cognitive Behavioral Therapy Psychoeducation    Lorri Frederick, LCSW

## 2023-04-07 NOTE — Group Note (Signed)
Date:  04/07/2023 Time:  5:19 PM  Group Topic/Focus:  Wellness Toolbox:   The focus of this group is to discuss various aspects of wellness, balancing those aspects and exploring ways to increase the ability to experience wellness.  Patients will create a wellness toolbox for use upon discharge.    Participation Level:  Active  Participation Quality:  Appropriate  Affect:  Appropriate  Cognitive:  Appropriate  Insight: Appropriate  Engagement in Group:  Engaged  Modes of Intervention:  Education  Additional Comments:     Reymundo Poll 04/07/2023, 5:19 PM

## 2023-04-07 NOTE — Progress Notes (Signed)
   04/07/23 2152  Psych Admission Type (Psych Patients Only)  Admission Status Voluntary  Psychosocial Assessment  Patient Complaints None  Eye Contact Fair  Facial Expression Other (Comment) (smiling)  Affect Other (Comment) (happy)  Speech Logical/coherent  Interaction Assertive  Motor Activity Other (Comment) (WDL)  Appearance/Hygiene Unremarkable  Behavior Characteristics Appropriate to situation  Mood Pleasant  Thought Process  Coherency WDL  Content WDL  Delusions None reported or observed  Perception WDL  Hallucination None reported or observed  Judgment Impaired  Confusion None  Danger to Self  Current suicidal ideation? Denies  Self-Injurious Behavior No self-injurious ideation or behavior indicators observed or expressed   Agreement Not to Harm Self Yes  Description of Agreement verbal contract for safety  Danger to Others  Danger to Others None reported or observed   D: Patient reports today has been one of her best days. Pt reports she can't stop smiling. Pt attended evening wrap up group and engaged in discussion. Pt observed smiling and appears happy. Pt stated it feels "weird smiling and being happy". A: Medications administered as prescribed. Support and encouragement provided as needed.  R: Patient remains safe on the unit. Plan of care ongoing for safety and stability.

## 2023-04-07 NOTE — Group Note (Signed)
Date:  04/07/2023 Time:  3:48 PM  Group Topic/Focus:  Wellness Toolbox:   The focus of this group is to discuss various aspects of wellness, balancing those aspects and exploring ways to increase the ability to experience wellness.  Patients will create a wellness toolbox for use upon discharge.    Participation Level:  Active  Participation Quality:  Appropriate  Affect:  Appropriate  Cognitive:  Appropriate  Insight: Appropriate  Engagement in Group:  Engaged  Modes of Intervention:  Education  Additional Comments:     Reymundo Poll 04/07/2023, 3:48 PM

## 2023-04-07 NOTE — Progress Notes (Signed)
   04/06/23 2304  Psych Admission Type (Psych Patients Only)  Admission Status Voluntary  Psychosocial Assessment  Patient Complaints Sleep disturbance;Anxiety  Eye Contact Fair  Facial Expression Anxious  Affect Anxious  Speech Logical/coherent  Interaction Assertive  Motor Activity Fidgety  Appearance/Hygiene Unremarkable  Behavior Characteristics Cooperative;Fidgety  Mood Depressed;Anxious  Thought Process  Coherency WDL  Content WDL  Delusions WDL  Perception WDL  Hallucination None reported or observed  Judgment Limited  Confusion WDL  Danger to Self  Current suicidal ideation? Denies  Danger to Others  Danger to Others None reported or observed   Pt affect anxious, mood depressed, rated day 7/10, pt states she wants to be more social with others. 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

## 2023-04-08 MED ORDER — VITAMIN D (ERGOCALCIFEROL) 1.25 MG (50000 UNIT) PO CAPS
50000.0000 [IU] | ORAL_CAPSULE | ORAL | Status: DC
  Administered 2023-04-08: 50000 [IU] via ORAL
  Filled 2023-04-08: qty 1

## 2023-04-08 NOTE — Plan of Care (Signed)
  Problem: Education: Goal: Emotional status will improve 04/08/2023 2351 by Earnest Conroy, RN Outcome: Not Progressing   Problem: Education: Goal: Mental status will improve 04/08/2023 2351 by Earnest Conroy, RN Outcome: Not Progressing 04/08/2023 2349 by Earnest Conroy, RN Outcome: Not Progressing

## 2023-04-08 NOTE — Group Note (Signed)
Recreation Therapy Group Note   Group Topic:Health and Wellness  Group Date: 04/08/2023 Start Time: 0930 End Time: 1000 Facilitators: Agustus Mane-McCall, LRT,CTRS Location: [address] Day[address]   Goal Area(s) Addresses:  Patient will identify positive stress management techniques. Patient will identify benefits of using stress management post d/c.     Group Description: Stress Management. LRT and patients discussed the importance of stress management.  Patients were to complete a worksheet that describing their largest source of stress in detail. Patient then needed to list two stressors they are currently experiencing and then circle the symptoms they experience in response to their stress. LRT also played music as patients completed the worksheet.   Affect/Mood: Appropriate and Anxious   Participation Level: Engaged   Participation Quality: Independent   Behavior: Appropriate   Speech/Thought Process: Focused   Insight: Good   Judgement: Good   Modes of Intervention: Music   Patient Response to Interventions:  Engaged   Education Outcome:  Acknowledges education   Clinical Observations/Individualized Feedback: Pt was engaged and expressed numerous times during group that she was full of energy and needed to get it out. Pt completed all exercises presented in group. Pt was social and appropriate during group session.    Plan: Continue to engage patient in RT group sessions 2-3x/week.   Cheyrl Buley-McCall, LRT,CTRS  04/08/2023 1:03 PM

## 2023-04-08 NOTE — [facility] Group Notes (Signed)
Adult Psychoeducational Group Note  Date:  04/08/2023 Time:  8:30 PM  Group Topic/Focus:  Wrap-Up Group:   The focus of this group is to help patients review their daily goal of treatment and discuss progress on daily workbooks.  Participation Level:  Active  Participation Quality:  Appropriate  Affect:  Appropriate  Cognitive:  Appropriate  Insight: Appropriate  Engagement in Group:  Engaged  Modes of Intervention:  Discussion  Additional Comments:  Pt. Attended group.  Joselyn Arrow 04/08/2023, 8:30 PM

## 2023-04-08 NOTE — [facility] Group Notes (Signed)
Adult Psychoeducational Group Note  Date:  04/08/2023 Time:  2:06 PM  Group Topic/Focus:  Managing Feelings:   The focus of this group is to identify what feelings patients have difficulty handling and develop a plan to handle them in a healthier way upon discharge.  Participation Level:  Active  Participation Quality:  Appropriate  Affect:  Appropriate  Cognitive:  Alert  Insight: Appropriate  Engagement in Group:  Engaged  Modes of Intervention:  Discussion and Exploration  Additional Comments:  Pt participated in group. Pt participated in group activity of using colors to identify current feelings. Pt described what each color means and the way it affect their feelings.    Viet Kemmerer 04/08/2023, 2:06 PM

## 2023-04-08 NOTE — Progress Notes (Signed)
   04/08/23 1300  Psych Admission Type (Psych Patients Only)  Admission Status Voluntary  Psychosocial Assessment  Patient Complaints None  Eye Contact Fair  Facial Expression Animated  Affect Appropriate to circumstance  Speech Logical/coherent  Interaction Assertive  Motor Activity Other (Comment) (WNL)  Appearance/Hygiene Unremarkable  Behavior Characteristics Appropriate to situation  Mood Pleasant  Thought Process  Coherency WDL  Content WDL  Delusions None reported or observed  Perception WDL  Hallucination None reported or observed  Judgment Impaired  Confusion None  Danger to Self  Current suicidal ideation? Denies  Self-Injurious Behavior No self-injurious ideation or behavior indicators observed or expressed   Agreement Not to Harm Self Yes  Description of Agreement verbal  Danger to Others  Danger to Others None reported or observed

## 2023-04-08 NOTE — Progress Notes (Signed)
[facility] MD Progress Note  04/08/2023 1:13 PM Charlotte Norman  MRN:  [id number]  Principal Problem: MDD (major depressive disorder), recurrent severe, without psychosis (HCC) Diagnosis: Principal Problem:   MDD (major depressive disorder), recurrent severe, without psychosis (HCC) Active Problems:   Suicidal ideation   GAD (generalized anxiety disorder)   PTSD (post-traumatic stress disorder)  Reason for Admission: Charlotte Norman is a 22 yr old female who presented to [facility] on 7/24 with worsening depression and Passive SI. PPHx is significant for Depression, Anxiety, and PTSD, and 1 Suicide Attempt (OD-2022), and no history of Self Injurious Behavior or Prior Psychiatric Hospitalizations.   24 hr chart review: Sleep Hours last night: Slept well as per patient.  Nursing Concerns: none  Behavioral episodes in the past 24 hrs: Medication Compliance: compliant  Vital Signs in the past 24 hrs: Slightly elevated earlier today morning at 117, but this is an episodic elevation. PRN Medications in the past 24 hrs: Hydroxyzine l  Patient assessment note: Patient reports a significantly less depressed mood as compared to time of admission, reports feeling more happy than usual, reports feeling jittery and twitchy yesterday, along with restlessness. Writer and patient talked about the rapid upward titration of Effexor most likely causing the above symptoms, and patient agreeable to reducing Effexor back down to a lower dose, and monitoring her symptoms for another day prior to discharge.  We will reduce Effexor to 75 mg daily, and monitor patient for another day, with a projected discharge date of 7/31, pending discharge safety plan been completed with mother, and outpatient follow-up appointments being made.  She denies SI/HI/AVH, continues to deny paranoia, continuing to report daily improvement in energy level, reports feeling optimistic about the future, reports continuing to  be more motivated about the future.  She reports a good sleep quality last night, reports a good appetite, denies being in any physical pain, reports that she is having bowel movements, with the last one being last night, encouraged to drink fluids, and states that she is doing so.  Patient has verbalized her interest in PHP program after discharge, CSW will be notified to talk to patient about this.  We will continue medications as listed below.  Total Time spent with patient: 45 minutes  Past Psychiatric History: See H & P  Past Medical History: History reviewed. No pertinent past medical history. History reviewed. No pertinent surgical history. Family History:  Family History  Problem Relation Age of Onset   Asthma Other    Family Psychiatric  History: See H & P Social History:  Social History   Substance and Sexual Activity  Alcohol Use No     Social History   Substance and Sexual Activity  Drug Use Not Currently   Types: Marijuana    Social History   Socioeconomic History   Marital status: Single    Spouse name: Not on file   Number of children: Not on file   Years of education: Not on file   Highest education level: Not on file  Occupational History   Not on file  Tobacco Use   Smoking status: Never   Smokeless tobacco: Never  Vaping Use   Vaping status: Every Day  Substance and Sexual Activity   Alcohol use: No   Drug use: Not Currently    Types: Marijuana   Sexual activity: Not Currently    Partners: Male  Other Topics Concern   Not on file  Social History Narrative   Not on file  Social Determinants of Health   Financial Resource Strain: Not on file  Food Insecurity: Food Insecurity Present (04/04/2023)   Hunger Vital Sign    Worried About Running Out of Food in the Last Year: Sometimes true    Ran Out of Food in the Last Year: Sometimes true  Transportation Needs: No Transportation Needs (04/04/2023)   PRAPARE - Scientist, research (physical sciences) (Medical): No    Lack of Transportation (Non-Medical): No  Physical Activity: Not on file  Stress: Not on file  Social Connections: Not on file  Sleep: Good  Appetite:  Fair  Current Medications: Current Facility-Administered Medications  Medication Dose Route Frequency Provider Last Rate Last Admin   acetaminophen (TYLENOL) tablet 650 mg  650 mg Oral Q6H PRN Sunday Corn, NP   650 mg at 04/05/23 1721   alum & mag hydroxide-simeth (MAALOX/MYLANTA) 200-200-20 MG/5ML suspension 30 mL  30 mL Oral Q4H PRN Sunday Corn, NP       diphenhydrAMINE (BENADRYL) capsule 50 mg  50 mg Oral TID PRN Sunday Corn, NP       Or   diphenhydrAMINE (BENADRYL) injection 50 mg  50 mg Intramuscular TID PRN Sunday Corn, NP       haloperidol (HALDOL) tablet 5 mg  5 mg Oral TID PRN Sunday Corn, NP       Or   haloperidol lactate (HALDOL) injection 5 mg  5 mg Intramuscular TID PRN Sunday Corn, NP       hydrOXYzine (ATARAX) tablet 25 mg  25 mg Oral TID PRN Sunday Corn, NP   25 mg at 04/07/23 1436   LORazepam (ATIVAN) tablet 2 mg  2 mg Oral TID PRN Sunday Corn, NP       Or   LORazepam (ATIVAN) injection 2 mg  2 mg Intramuscular TID PRN Sunday Corn, NP       magnesium hydroxide (MILK OF MAGNESIA) suspension 30 mL  30 mL Oral Daily PRN Sunday Corn, NP       QUEtiapine (SEROQUEL XR) 24 hr tablet 50 mg  50 mg Oral QHS Sunday Corn, NP   50 mg at 04/07/23 2102   venlafaxine XR (EFFEXOR-XR) 24 hr capsule 150 mg  150 mg Oral Q breakfast Starleen Blue, NP   150 mg at 04/08/23 0800   Vitamin D (Ergocalciferol) (DRISDOL) 1.25 MG (50000 UNIT) capsule 50,000 Units  50,000 Units Oral Q7 days Starleen Blue, NP        Lab Results:  Results for orders placed or performed during the hospital encounter of 04/03/23 (from the past 48 hour(s))  Lipid panel     Status: Abnormal   Collection Time: 04/07/23  6:30 AM  Result Value Ref Range   Cholesterol 202  (H) 0 - 200 mg/dL   Triglycerides 38 <161 mg/dL   HDL 72 >09 mg/dL   Total CHOL/HDL Ratio 2.8 RATIO   VLDL 8 0 - 40 mg/dL   LDL Cholesterol 604 (H) 0 - 99 mg/dL    Comment:        Total Cholesterol/HDL:CHD Risk Coronary Heart Disease Risk Table                     Men   Women  1/2 Average Risk   3.4   3.3  Average Risk       5.0   4.4  2 X Average Risk   9.6  7.1  3 X Average Risk  23.4   11.0        Use the calculated Patient Ratio above and the CHD Risk Table to determine the patient's CHD Risk.        ATP III CLASSIFICATION (LDL):  <100     mg/dL   Optimal  433-295  mg/dL   Near or Above                    Optimal  130-159  mg/dL   Borderline  188-416  mg/dL   High  >606     mg/dL   Very High Performed at [facility], [address], [place], [place] [address]   CBC with Differential/Platelet     Status: Abnormal   Collection Time: 04/07/23  6:30 AM  Result Value Ref Range   WBC 3.7 (L) 4.0 - 10.5 K/uL   RBC 3.96 3.87 - 5.11 MIL/uL   Hemoglobin 11.9 (L) 12.0 - 15.0 g/dL   HCT 10.9 32.3 - 55.7 %   MCV 92.9 80.0 - 100.0 fL   MCH 30.1 26.0 - 34.0 pg   MCHC 32.3 30.0 - 36.0 g/dL   RDW 32.2 02.5 - 42.7 %   Platelets 234 150 - 400 K/uL   nRBC 0.0 0.0 - 0.2 %   Neutrophils Relative % 26 %   Neutro Abs 1.0 (L) 1.7 - 7.7 K/uL   Lymphocytes Relative 61 %   Lymphs Abs 2.3 0.7 - 4.0 K/uL   Monocytes Relative 11 %   Monocytes Absolute 0.4 0.1 - 1.0 K/uL   Eosinophils Relative 1 %   Eosinophils Absolute 0.0 0.0 - 0.5 K/uL   Basophils Relative 1 %   Basophils Absolute 0.0 0.0 - 0.1 K/uL   Immature Granulocytes 0 %   Abs Immature Granulocytes 0.01 0.00 - 0.07 K/uL    Comment: Performed at [facility], [address], [place], [place] [address]  VITAMIN D 25 Hydroxy (Vit-D Deficiency, Fractures)     Status: Abnormal   Collection Time: 04/07/23  6:30 AM  Result Value Ref Range   Vit D, 25-Hydroxy 11.71 (L) 30 - 100 ng/mL    Comment:  (NOTE) Vitamin D deficiency has been defined by the Institute of Medicine  and an Endocrine Society practice guideline as a level of serum 25-OH  vitamin D less than 20 ng/mL (1,2). The Endocrine Society went on to  further define vitamin D insufficiency as a level between 21 and 29  ng/mL (2).  1. IOM (Institute of Medicine). 2010. Dietary reference intakes for  calcium and D. [place]: [employer]. 2. Holick MF, Binkley Melvin, Bischoff-Ferrari HA, et al. Evaluation,  treatment, and prevention of vitamin D deficiency: an Endocrine  Society clinical practice guideline, JCEM. 2011 Jul; 96(7): 1911-30.  Performed at [facility], [address] [place], [place] [address]     Blood Alcohol level:  Lab Results  Component Value Date   ETH <10 04/03/2023    Metabolic Disorder Labs: Lab Results  Component Value Date   HGBA1C 5.2 04/03/2023   MPG 102.54 04/03/2023   Lab Results  Component Value Date   PROLACTIN 12.0 04/03/2023   Lab Results  Component Value Date   CHOL 202 (H) 04/07/2023   TRIG 38 04/07/2023   HDL 72 04/07/2023   CHOLHDL 2.8 04/07/2023   VLDL 8 04/07/2023   LDLCALC 122 (H) 04/07/2023   LDLCALC 140 (  H) 04/03/2023    Physical Findings: AIMS:  , ,  ,  ,    CIWA:    COWS:     Musculoskeletal: Strength & Muscle Tone: within normal limits Gait & Station: normal Patient leans: N/A  Psychiatric Specialty Exam:  Presentation  General Appearance:  Appropriate for Environment  Eye Contact: Good  Speech: Clear and Coherent  Speech Volume: Normal  Handedness: Right   Mood and Affect  Mood: Depressed; Anxious  Affect: Congruent   Thought Process  Thought Processes: Coherent  Descriptions of Associations:Intact  Orientation:Full (Time, Place and Person)  Thought Content:Logical  History of Schizophrenia/Schizoaffective disorder:No  Duration of Psychotic Symptoms:No data  recorded Hallucinations:Hallucinations: None  Ideas of Reference:None  Suicidal Thoughts:Suicidal Thoughts: No  Homicidal Thoughts:Homicidal Thoughts: No   Sensorium  Memory: Immediate Good  Judgment: Fair  Insight: Fair   Art therapist  Concentration: Fair  Attention Span: Good  Recall: Good  Fund of Knowledge: Good  Language: Good   Psychomotor Activity  Psychomotor Activity: Psychomotor Activity: Normal   Assets  Assets: Communication Skills; Resilience   Sleep  Sleep: Sleep: Good Number of Hours of Sleep: 7.5    Physical Exam: Physical Exam Constitutional:      Appearance: Normal appearance.  HENT:     Head: Normocephalic.     Nose: No congestion.  Eyes:     Pupils: Pupils are equal, round, and reactive to light.  Musculoskeletal:     Cervical back: Normal range of motion.  Neurological:     Mental Status: She is alert and oriented to person, place, and time.    Review of Systems  Constitutional:  Negative for fever.  HENT:  Negative for hearing loss.   Eyes:  Negative for blurred vision.  Respiratory:  Negative for cough.   Cardiovascular:  Negative for chest pain.  Gastrointestinal:  Negative for heartburn.  Genitourinary:  Negative for dysuria.  Musculoskeletal:  Negative for myalgias.  Skin:  Negative for rash.  Neurological:  Negative for dizziness.  Psychiatric/Behavioral:  Positive for depression and substance abuse. Negative for hallucinations, memory loss and suicidal ideas. The patient is nervous/anxious and has insomnia.    Blood pressure 122/79, pulse (!) 117, temperature 98.4 F (36.9 C), temperature source Oral, resp. rate 16, height 5\' 10"  (1.778 m), weight 51.7 kg, SpO2 100%. Body mass index is 16.36 kg/m.  Treatment Plan Summary: Daily contact with patient to assess and evaluate symptoms and progress in treatment and Medication management  Safety and Monitoring: Voluntary admission to inpatient  psychiatric unit for safety, stabilization and treatment Daily contact with patient to assess and evaluate symptoms and progress in treatment Patient's case to be discussed in multi-disciplinary team meeting Observation Level : q15 minute checks Vital signs: q12 hours Precautions: Safety  Long Term Goal(s): Improvement in symptoms so as ready for discharge  Short Term Goals: Ability to identify changes in lifestyle to reduce recurrence of condition will improve, Ability to verbalize feelings will improve, Ability to disclose and discuss suicidal ideas, Ability to demonstrate self-control will improve, Ability to identify and develop effective coping behaviors will improve, Ability to maintain clinical measurements within normal limits will improve, and Compliance with prescribed medications will improve  Diagnoses Principal Problem:   MDD (major depressive disorder), recurrent severe, without psychosis (HCC) Active Problems:   Suicidal ideation   GAD (generalized anxiety disorder)   PTSD (post-traumatic stress disorder)  Medications -Start Vitamin D 50.000 units daily for low Vitamin D of 11.71 -Decrease Effexor  XR from 150 mg to 75 mg daily for depression & anxiety due to complaints of jitteriness & restlessness. -Continue Seroquel XR 50 mg at bedtime -Continue Hydroxyzine 25 mg TID PRN for anxiety -Continue Agitation Protocol: Haldol/Ativan/Benadryl (See MAR for details)  Other PRNS -Continue Tylenol 650 mg every 6 hours PRN for mild pain -Continue Maalox 30 mg every 4 hrs PRN for indigestion -Continue Milk of Magnesia as needed every 6 hrs for constipation  Labs Reviewed: Low Neutrophils on admission, and still low on repeat, WBCs improved with repeat. Repeat CBC continues to show neutrophil counts as low. Will recommend outpatient f/u at discharge. Supplementing low vitamin D with Vitamin D 50.000 units   Discharge Planning: Social work and case management to assist with  discharge planning and identification of hospital follow-up needs prior to discharge Estimated LOS: 5-7 days Discharge Concerns: Need to establish a safety plan; Medication compliance and effectiveness Discharge Goals: Return home with outpatient referrals for mental health follow-up including medication management/psychotherapy  I certify that inpatient services furnished can reasonably be expected to improve the patient's condition.    Starleen Blue, NP 7/29/20241:14 PM

## 2023-04-08 NOTE — Plan of Care (Signed)
  Problem: Education: Goal: Mental status will improve Outcome: Progressing   Problem: Education: Goal: Verbalization of understanding the information provided will improve Outcome: Progressing

## 2023-04-08 NOTE — [facility] Group Notes (Signed)
Adult Psychoeducational Group Note  Date:  04/08/2023 Time:  1:12 PM  Group Topic/Focus:  Goals Group:   The focus of this group is to help patients establish daily goals to achieve during treatment and discuss how the patient can incorporate goal setting into their daily lives to aide in recovery. Recovery Goals:   The focus of this group is to identify appropriate goals for recovery and establish a plan to achieve them.  Participation Level:  Active  Participation Quality:  Attentive  Affect:  Appropriate  Cognitive:  Appropriate  Insight: Appropriate  Engagement in Group:  Engaged  Modes of Intervention:  Discussion, Education, and Exploration  Additional Comments:  Pt participated in group. Pt stated her goal is to speak with social worker and develop aftercare plan. Pt identified areas of her life that led to seeking help. Pt identified triggers and coping skills.     Charlotte Norman 04/08/2023, 1:12 PM

## 2023-04-08 NOTE — Plan of Care (Signed)
  Problem: Education: Goal: Knowledge of [facility] Education information/materials will improve Outcome: Progressing Goal: Verbalization of understanding the information provided will improve Outcome: Progressing   Problem: Health Behavior/Discharge Planning: Goal: Identification of resources available to assist in meeting health care needs will improve Outcome: Progressing   Problem: Health Behavior/Discharge Planning: Goal: Identification of resources available to assist in meeting health care needs will improve Outcome: Progressing

## 2023-04-08 NOTE — Progress Notes (Signed)
   04/08/23 2244  Psych Admission Type (Psych Patients Only)  Admission Status Voluntary  Psychosocial Assessment  Patient Complaints None  Eye Contact Fair  Facial Expression Animated;Anxious  Affect Appropriate to circumstance  Speech Logical/coherent  Interaction Assertive  Motor Activity Other (Comment) (wdl)  Appearance/Hygiene Unremarkable  Behavior Characteristics Anxious  Mood Anxious;Depressed  Thought Process  Coherency WDL  Content WDL  Delusions None reported or observed  Perception WDL  Hallucination None reported or observed  Judgment Impaired  Confusion None  Danger to Self  Current suicidal ideation? Denies  Self-Injurious Behavior No self-injurious ideation or behavior indicators observed or expressed   Agreement Not to Harm Self Yes  Description of Agreement verbal contract for safety  Danger to Others  Danger to Others None reported or observed   D: Patient reports having a panic attack this evening. Pt stated she felt sad when her "friends"  discharged and after a conversation with her mother she broke down. Pt will not elaborate the details of the conversation. Pt able to calm self down after talking to Clinical research associate and other staff members.  A: Medications administered as prescribed. Support and encouragement provided as needed.  R: Patient remains safe on the unit. Plan of care ongoing for safety and stability.

## 2023-04-08 NOTE — Group Note (Signed)
Occupational Therapy Group Note   Group Topic:Goal Setting  Group Date: 04/08/2023 Start Time: 1430 End Time: 1505 Facilitators: Ted Mcalpine, OT   Group Description: Group encouraged engagement and participation through discussion focused on goal setting. Group members were introduced to goal-setting using the SMART Goal framework, identifying goals as Specific, Measureable, Acheivable, Relevant, and Time-Bound. Group members took time from group to create their own personal goal reflecting the SMART goal template and shared for review by peers and OT.    Therapeutic Goal(s):  Identify at least one goal that fits the SMART framework    Participation Level: Engaged   Participation Quality: Independent   Behavior: Appropriate   Speech/Thought Process: Relevant   Affect/Mood: Appropriate   Insight: Fair   Judgement: Fair      Modes of Intervention: Education  Patient Response to Interventions:  Attentive   Plan: Continue to engage patient in OT groups 2 - 3x/week.  04/08/2023  Ted Mcalpine, OT  Kerrin Champagne, OT

## 2023-04-09 MED ORDER — PROPRANOLOL HCL 10 MG PO TABS
10.0000 mg | ORAL_TABLET | Freq: Two times a day (BID) | ORAL | Status: DC
  Filled 2023-04-09 (×2): qty 1

## 2023-04-09 MED ORDER — PROPRANOLOL HCL 10 MG PO TABS
5.0000 mg | ORAL_TABLET | Freq: Two times a day (BID) | ORAL | Status: DC
  Administered 2023-04-09 – 2023-04-11 (×4): 5 mg via ORAL
  Filled 2023-04-09 (×8): qty 0.5

## 2023-04-09 NOTE — [facility] Group Notes (Signed)
Adult Psychoeducational Group Note  Date:  04/09/2023 Time:  10:29 PM  Group Topic/Focus:  Wrap-Up Group:   The focus of this group is to help patients review their daily goal of treatment and discuss progress on daily workbooks.  Participation Level:  Active  Participation Quality:  Appropriate  Affect:  Appropriate  Cognitive:  Appropriate  Insight: Appropriate  Engagement in Group:  Engaged  Modes of Intervention:  Discussion  Additional Comments:  Pt, attended group. Stated day was 9 out of 10, Pt. Stated had a good day spoke with family.  Joselyn Arrow 04/09/2023, 10:29 PM

## 2023-04-09 NOTE — Plan of Care (Signed)
  Problem: Education: Goal: Emotional status will improve Outcome: Progressing Goal: Mental status will improve Outcome: Progressing Goal: Verbalization of understanding the information provided will improve Outcome: Progressing   Problem: Activity: Goal: Interest or engagement in activities will improve Outcome: Progressing Goal: Sleeping patterns will improve Outcome: Progressing   Problem: Coping: Goal: Ability to verbalize frustrations and anger appropriately will improve Outcome: Progressing Goal: Ability to demonstrate self-control will improve Outcome: Progressing   Problem: Health Behavior/Discharge Planning: Goal: Identification of resources available to assist in meeting health care needs will improve Outcome: Progressing Goal: Compliance with treatment plan for underlying cause of condition will improve Outcome: Progressing   Problem: Physical Regulation: Goal: Ability to maintain clinical measurements within normal limits will improve Outcome: Progressing

## 2023-04-09 NOTE — [facility] Group Notes (Signed)
Adult Psychoeducational Group Note  Date:  04/09/2023 Time:  11:10 AM  Group Topic/Focus:  Goals Group:   The focus of this group is to help patients establish daily goals to achieve during treatment and discuss how the patient can incorporate goal setting into their daily lives to aide in recovery. Orientation:   The focus of this group is to educate the patient on the purpose and policies of crisis stabilization and provide a format to answer questions about their admission.  The group details unit policies and expectations of patients while admitted.  Participation Level:  Active  Participation Quality:  Appropriate  Affect:  Appropriate  Cognitive:  Appropriate  Insight: Appropriate  Engagement in Group:  Engaged  Modes of Intervention:  Discussion  Additional Comments:  Pt attended the goals group and remained appropriate and engaged throughout the duration of the group.   Fara Olden O 04/09/2023, 11:10 AM

## 2023-04-09 NOTE — Progress Notes (Addendum)
Patient denies SI, HI and AVH. Patient rates depression 4/10, hopelessness 4/10 and anxiety 5/10. Patient's goal is to manage anxiety and work on coping skills to sustain the progress made here when she is discharged. Patient remains safe on the unit Q 15 min safety checks ongoing.   04/09/23 0900  Psych Admission Type (Psych Patients Only)  Admission Status Voluntary  Psychosocial Assessment  Patient Complaints Anxiety  Eye Contact Fair  Facial Expression Animated;Anxious  Affect Appropriate to circumstance  Speech Logical/coherent  Interaction Assertive  Motor Activity Other (Comment) (WNL)  Appearance/Hygiene Unremarkable  Behavior Characteristics Anxious  Mood Anxious  Thought Process  Coherency WDL  Content WDL  Delusions None reported or observed  Perception WDL  Hallucination None reported or observed  Judgment Impaired  Confusion None  Danger to Self  Current suicidal ideation? Denies  Self-Injurious Behavior No self-injurious ideation or behavior indicators observed or expressed   Agreement Not to Harm Self Yes  Description of Agreement verbal  Danger to Others  Danger to Others None reported or observed

## 2023-04-09 NOTE — Group Note (Signed)
Recreation Therapy Group Note   Group Topic:Animal Assisted Therapy   Group Date: 04/09/2023 Start Time: 4098 End Time: 1030 Facilitators: Zacchary Pompei-McCall, LRT,CTRS Location: [address] Dayroom   Animal-Assisted Activity (AAA) Program Checklist/Progress Notes Patient Eligibility Criteria Checklist & Daily Group note for Rec Tx Intervention  AAA/T Program Assumption of Risk Form signed by Patient/ or Parent Legal Guardian Yes  Patient is free of allergies or severe asthma Yes  Patient reports no fear of animals Yes  Patient reports no history of cruelty to animals Yes  Patient understands his/her participation is voluntary Yes  Patient washes hands before animal contact Yes  Patient washes hands after animal contact Yes   Affect/Mood: Appropriate   Participation Level: Engaged   Participation Quality: Independent   Behavior: Appropriate   Speech/Thought Process: Focused   Insight: Good   Judgement: Good   Modes of Intervention: Teaching laboratory technician   Patient Response to Interventions:  Engaged   Education Outcome:  Acknowledges education   Clinical Observations/Individualized Feedback: Patient attended session and interacted appropriately with therapy dog and peers. Patient asked appropriate questions about therapy dog and his training. Patient shared stories about their pets at home with group.     Plan: Continue to engage patient in RT group sessions 2-3x/week.   Charlotte Norman, LRT,CTRS 04/09/2023 1:25 PM

## 2023-04-09 NOTE — Progress Notes (Signed)
[facility] MD Progress Note  04/09/2023 3:18 PM Charlotte Norman  MRN:  [id number]  Principal Problem: MDD (major depressive disorder), recurrent severe, without psychosis (HCC) Diagnosis: Principal Problem:   MDD (major depressive disorder), recurrent severe, without psychosis (HCC) Active Problems:   Suicidal ideation   GAD (generalized anxiety disorder)   PTSD (post-traumatic stress disorder)  Reason for Admission: Charlotte Norman is a 22 yr old female who presented to [facility] on 7/24 with worsening depression and Passive SI. PPHx is significant for Depression, Anxiety, and PTSD, and 1 Suicide Attempt (OD-2022), and no history of Self Injurious Behavior or Prior Psychiatric Hospitalizations.   24 hr chart review: Sleep Hours last night: Slept well as per patient.  Nursing Concerns: none  Behavioral episodes in the past 24 hrs: none  Medication Compliance: Compliant  Vital Signs in the past 24 hrs: Tachycardia has been persistent (see V/S flow sheets). PRN Medications in the past 24 hrs: Hydroxyzine & Tylenol  Patient assessment note:  On assessment today, the pt reports that their mood is depressed, but improving. Rates depression as 5, with 10 being worse. Reports that anxiety is improving, rates anxiety as a 5, with 10 being worse, seems to be under reporting anxiety.  She appears tense, and is holding onto both hands, as she is talking to Clinical research associate. Sleep is fair. Appetite is fair but improving. Concentration is fair. Energy level is fair and improving. Denies suicidal thoughts.  Denies suicidal intent and plan.  Denies having any HI.  Denies having psychotic symptoms.  Denies AVH, denies paranoia.  Denies having side effects to current psychiatric medications.   We discussed changes to current medication regimen, including adding Inderal 10 mg twice daily for tachycardia and anxiety.  Patient educated on rationales, benefits, and possible side effects of  medications, and verbalized understanding. Patient has verbalized her interest in PHP program after discharge, CSW will be notified to talk to patient about this.  Patient reports today that she is not ready for discharge tomorrow, verbalizes that she would like another day of hospitalization while a PHP program is located. She reports a panic attack which happened last night along with ,"feeling as though I didn't exist". She reports that those feelings have resolved, and she would love another day of hospitalization so as to continue to learn coping mechanisms for her anxiety. we will continue medications as listed below.  Total Time spent with patient: 45 minutes  Past Psychiatric History: See H & P  Past Medical History: History reviewed. No pertinent past medical history. History reviewed. No pertinent surgical history. Family History:  Family History  Problem Relation Age of Onset   Asthma Other    Family Psychiatric  History: See H & P Social History:  Social History   Substance and Sexual Activity  Alcohol Use No     Social History   Substance and Sexual Activity  Drug Use Not Currently   Types: Marijuana    Social History   Socioeconomic History   Marital status: Single    Spouse name: Not on file   Number of children: Not on file   Years of education: Not on file   Highest education level: Not on file  Occupational History   Not on file  Tobacco Use   Smoking status: Never   Smokeless tobacco: Never  Vaping Use   Vaping status: Every Day  Substance and Sexual Activity   Alcohol use: No   Drug use: Not Currently  Types: Marijuana   Sexual activity: Not Currently    Partners: Male  Other Topics Concern   Not on file  Social History Narrative   Not on file   Social Determinants of Health   Financial Resource Strain: Not on file  Food Insecurity: Food Insecurity Present (04/04/2023)   Hunger Vital Sign    Worried About Running Out of Food in the Last  Year: Sometimes true    Ran Out of Food in the Last Year: Sometimes true  Transportation Needs: No Transportation Needs (04/04/2023)   PRAPARE - Administrator, Civil Service (Medical): No    Lack of Transportation (Non-Medical): No  Physical Activity: Not on file  Stress: Not on file  Social Connections: Not on file  Sleep: Good  Appetite:  Fair  Current Medications: Current Facility-Administered Medications  Medication Dose Route Frequency Provider Last Rate Last Admin   acetaminophen (TYLENOL) tablet 650 mg  650 mg Oral Q6H PRN Sunday Corn, NP   650 mg at 04/08/23 2115   alum & mag hydroxide-simeth (MAALOX/MYLANTA) 200-200-20 MG/5ML suspension 30 mL  30 mL Oral Q4H PRN Sunday Corn, NP       diphenhydrAMINE (BENADRYL) capsule 50 mg  50 mg Oral TID PRN Sunday Corn, NP       Or   diphenhydrAMINE (BENADRYL) injection 50 mg  50 mg Intramuscular TID PRN Sunday Corn, NP       haloperidol (HALDOL) tablet 5 mg  5 mg Oral TID PRN Sunday Corn, NP       Or   haloperidol lactate (HALDOL) injection 5 mg  5 mg Intramuscular TID PRN Sunday Corn, NP       hydrOXYzine (ATARAX) tablet 25 mg  25 mg Oral TID PRN Sunday Corn, NP   25 mg at 04/08/23 1355   LORazepam (ATIVAN) tablet 2 mg  2 mg Oral TID PRN Sunday Corn, NP       Or   LORazepam (ATIVAN) injection 2 mg  2 mg Intramuscular TID PRN Sunday Corn, NP       magnesium hydroxide (MILK OF MAGNESIA) suspension 30 mL  30 mL Oral Daily PRN Sunday Corn, NP       propranolol (INDERAL) tablet 10 mg  10 mg Oral BID Starleen Blue, NP       QUEtiapine (SEROQUEL XR) 24 hr tablet 50 mg  50 mg Oral QHS Sunday Corn, NP   50 mg at 04/08/23 2113   venlafaxine XR (EFFEXOR-XR) 24 hr capsule 150 mg  150 mg Oral Q breakfast Starleen Blue, NP   150 mg at 04/09/23 3086   Vitamin D (Ergocalciferol) (DRISDOL) 1.25 MG (50000 UNIT) capsule 50,000 Units  50,000 Units Oral Q7 days Starleen Blue, NP    50,000 Units at 04/08/23 1357    Lab Results:  No results found for this or any previous visit (from the past 48 hour(s)).   Blood Alcohol level:  Lab Results  Component Value Date   ETH <10 04/03/2023    Metabolic Disorder Labs: Lab Results  Component Value Date   HGBA1C 5.2 04/03/2023   MPG 102.54 04/03/2023   Lab Results  Component Value Date   PROLACTIN 12.0 04/03/2023   Lab Results  Component Value Date   CHOL 202 (H) 04/07/2023   TRIG 38 04/07/2023   HDL 72 04/07/2023   CHOLHDL 2.8 04/07/2023   VLDL 8 04/07/2023   LDLCALC 122 (H)  04/07/2023   LDLCALC 140 (H) 04/03/2023    Physical Findings: AIMS:  , ,  ,  ,    CIWA:    COWS:     Musculoskeletal: Strength & Muscle Tone: within normal limits Gait & Station: normal Patient leans: N/A  Psychiatric Specialty Exam:  Presentation  General Appearance:  Appropriate for Environment  Eye Contact: Fair  Speech: Clear and Coherent  Speech Volume: Normal  Handedness: Right   Mood and Affect  Mood: Depressed; Anxious  Affect: Congruent   Thought Process  Thought Processes: Coherent  Descriptions of Associations:Intact  Orientation:Full (Time, Place and Person)  Thought Content:Logical  History of Schizophrenia/Schizoaffective disorder:No  Duration of Psychotic Symptoms:No data recorded Hallucinations:Hallucinations: None  Ideas of Reference:None  Suicidal Thoughts:Suicidal Thoughts: No  Homicidal Thoughts:Homicidal Thoughts: No   Sensorium  Memory: Immediate Good  Judgment: Fair  Insight: Fair   Art therapist  Concentration: Fair  Attention Span: Fair  Recall: Fair  Fund of Knowledge: Fair  Language: Good   Psychomotor Activity  Psychomotor Activity: Psychomotor Activity: Normal   Assets  Assets: Communication Skills   Sleep  Sleep: Sleep: Good    Physical Exam: Physical Exam Constitutional:      Appearance: Normal appearance.   HENT:     Head: Normocephalic.     Nose: No congestion.  Eyes:     Pupils: Pupils are equal, round, and reactive to light.  Musculoskeletal:     Cervical back: Normal range of motion.  Neurological:     Mental Status: She is alert and oriented to person, place, and time.    Review of Systems  Constitutional:  Negative for fever.  HENT:  Negative for hearing loss.   Eyes:  Negative for blurred vision.  Respiratory:  Negative for cough.   Cardiovascular:  Negative for chest pain.  Gastrointestinal:  Negative for heartburn.  Genitourinary:  Negative for dysuria.  Musculoskeletal:  Negative for myalgias.  Skin:  Negative for rash.  Neurological:  Negative for dizziness.  Psychiatric/Behavioral:  Positive for depression and substance abuse. Negative for hallucinations, memory loss and suicidal ideas. The patient is nervous/anxious and has insomnia.    Blood pressure 131/89, pulse (!) 110, temperature 98.2 F (36.8 C), temperature source Oral, resp. rate 18, height 5\' 10"  (1.778 m), weight 51.7 kg, SpO2 100%. Body mass index is 16.36 kg/m.  Treatment Plan Summary: Daily contact with patient to assess and evaluate symptoms and progress in treatment and Medication management  Safety and Monitoring: Voluntary admission to inpatient psychiatric unit for safety, stabilization and treatment Daily contact with patient to assess and evaluate symptoms and progress in treatment Patient's case to be discussed in multi-disciplinary team meeting Observation Level : q15 minute checks Vital signs: q12 hours Precautions: Safety  Long Term Goal(s): Improvement in symptoms so as ready for discharge  Short Term Goals: Ability to identify changes in lifestyle to reduce recurrence of condition will improve, Ability to verbalize feelings will improve, Ability to disclose and discuss suicidal ideas, Ability to demonstrate self-control will improve, Ability to identify and develop effective coping  behaviors will improve, Ability to maintain clinical measurements within normal limits will improve, and Compliance with prescribed medications will improve  Diagnoses Principal Problem:   MDD (major depressive disorder), recurrent severe, without psychosis (HCC) Active Problems:   Suicidal ideation   GAD (generalized anxiety disorder)   PTSD (post-traumatic stress disorder)  Medications -Start Inderal 10 mg BID for tachycardia & GAD -Continue Vitamin D 50.000 units daily for  low Vitamin D of 11.71 -Continue Effexor XR 150 mg daily for depression & anxiety  -Continue Seroquel XR 50 mg at bedtime -Continue Hydroxyzine 25 mg TID PRN for anxiety -Continue Agitation Protocol: Haldol/Ativan/Benadryl (See MAR for details)  Other PRNS -Continue Tylenol 650 mg every 6 hours PRN for mild pain -Continue Maalox 30 mg every 4 hrs PRN for indigestion -Continue Milk of Magnesia as needed every 6 hrs for constipation  Labs Reviewed: Low Neutrophils on admission, and still low on repeat, WBCs improved with repeat. Repeat CBC continues to show neutrophil counts as low. Will recommend outpatient f/u at discharge.   Discharge Planning: Social work and case management to assist with discharge planning and identification of hospital follow-up needs prior to discharge Estimated LOS: 5-7 days Discharge Concerns: Need to establish a safety plan; Medication compliance and effectiveness Discharge Goals: Return home with outpatient referrals for mental health follow-up including medication management/psychotherapy  I certify that inpatient services furnished can reasonably be expected to improve the patient's condition.    Starleen Blue, NP 7/30/20243:18 PM    Patient ID: Charlotte Norman, female   DOB: 2001-01-08, 22 y.o.   MRN: [id number]

## 2023-04-10 ENCOUNTER — Encounter (HOSPITAL_COMMUNITY): Payer: Self-pay

## 2023-04-10 NOTE — [facility] Group Notes (Addendum)
Spiritual care group on grief and loss facilitated by Chaplain Dyanne Carrel, B[facility]  Group Goal: Support / Education around grief and loss  Members engage in facilitated group support and psycho-social education.  Group Description:  Following introductions and group rules, group members engaged in facilitated group dialogue and support around topic of loss, with particular support around experiences of loss in their lives. Group Identified types of loss (relationships / self / things) and identified patterns, circumstances, and changes that precipitate losses. Reflected on thoughts / feelings around loss, normalized grief responses, and recognized variety in grief experience. Group encouraged individual reflection on safe space and on the coping skills that they are already utilizing.  Group drew on Adlerian / Rogerian and narrative framework  Patient Progress: Pt attended group and actively engaged in group activities and conversation.  She shared about some of her grief from childhood and her comments contributed positively to the conversation.

## 2023-04-10 NOTE — Group Note (Signed)
Recreation Therapy Group Note   Group Topic:Other  Group Date: 04/10/2023 Start Time: 4098 End Time: 1015 Facilitators: Kiylah Loyer-McCall, LRT,CTRS Location: [address] Dayroom   Goal Area(s) Addresses:  Patient will identify positive leisure and recreation activities.  Patient will identify one positive benefit of participation in leisure activities.    Group Description: Music Trivia. Patients were divided in to 2 groups for game play. LRT read song lyrics from songs in the  1990s and 2000s hip hop and R&B from a playing card. Each team takes turns answering the question. If they are unable to answer the question, the other team gets the chance to steal the point. The team with the most points wins the game.    Affect/Mood: Appropriate   Participation Level: Engaged   Participation Quality: Independent   Behavior: Appropriate   Speech/Thought Process: Focused   Insight: Good   Judgement: Good   Modes of Intervention: Competitive Play   Patient Response to Interventions:  Engaged   Education Outcome:  Acknowledges education   Clinical Observations/Individualized Feedback: Pt was engaged and focused. Pt was trying effortlessly to answer the questions. Pt had moments were she second guessed herself and not answered only to find out she was right.     Plan: Continue to engage patient in RT group sessions 2-3x/week.   Charlotte Norman, LRT,CTRS 04/10/2023 12:05 PM

## 2023-04-10 NOTE — Progress Notes (Signed)
   04/10/23 1100  Psych Admission Type (Psych Patients Only)  Admission Status Voluntary  Psychosocial Assessment  Patient Complaints None  Eye Contact Fair  Facial Expression Anxious  Affect Appropriate to circumstance  Speech Logical/coherent  Interaction Cautious  Motor Activity Other (Comment) (WNL)  Appearance/Hygiene Unremarkable  Behavior Characteristics Cooperative;Anxious  Mood Depressed;Anxious;Pleasant  Thought Process  Coherency WDL  Content WDL  Delusions None reported or observed  Perception WDL  Hallucination None reported or observed  Judgment Limited  Confusion None  Danger to Self  Current suicidal ideation? Denies  Self-Injurious Behavior No self-injurious ideation or behavior indicators observed or expressed   Agreement Not to Harm Self Yes  Description of Agreement verbal  Danger to Others  Danger to Others None reported or observed

## 2023-04-10 NOTE — BH IP Treatment Plan (Signed)
Interdisciplinary Treatment and Diagnostic Plan Update  04/10/2023 Time of Session: 200 Charlotte Norman MRN: [id number]  Principal Diagnosis: MDD (major depressive disorder), recurrent severe, without psychosis (HCC)  Secondary Diagnoses: Principal Problem:   MDD (major depressive disorder), recurrent severe, without psychosis (HCC) Active Problems:   Suicidal ideation   GAD (generalized anxiety disorder)   PTSD (post-traumatic stress disorder)   Current Medications:  Current Facility-Administered Medications  Medication Dose Route Frequency Provider Last Rate Last Admin   acetaminophen (TYLENOL) tablet 650 mg  650 mg Oral Q6H PRN Sunday Corn, NP   650 mg at 04/08/23 2115   alum & mag hydroxide-simeth (MAALOX/MYLANTA) 200-200-20 MG/5ML suspension 30 mL  30 mL Oral Q4H PRN Sunday Corn, NP       diphenhydrAMINE (BENADRYL) capsule 50 mg  50 mg Oral TID PRN Sunday Corn, NP       Or   diphenhydrAMINE (BENADRYL) injection 50 mg  50 mg Intramuscular TID PRN Sunday Corn, NP       haloperidol (HALDOL) tablet 5 mg  5 mg Oral TID PRN Sunday Corn, NP       Or   haloperidol lactate (HALDOL) injection 5 mg  5 mg Intramuscular TID PRN Sunday Corn, NP       hydrOXYzine (ATARAX) tablet 25 mg  25 mg Oral TID PRN Sunday Corn, NP   25 mg at 04/09/23 2058   LORazepam (ATIVAN) tablet 2 mg  2 mg Oral TID PRN Sunday Corn, NP       Or   LORazepam (ATIVAN) injection 2 mg  2 mg Intramuscular TID PRN Sunday Corn, NP       magnesium hydroxide (MILK OF MAGNESIA) suspension 30 mL  30 mL Oral Daily PRN Sunday Corn, NP       propranolol (INDERAL) tablet 5 mg  5 mg Oral BID Starleen Blue, NP   5 mg at 04/10/23 0754   QUEtiapine (SEROQUEL XR) 24 hr tablet 50 mg  50 mg Oral QHS Sunday Corn, NP   50 mg at 04/09/23 2056   venlafaxine XR (EFFEXOR-XR) 24 hr capsule 150 mg  150 mg Oral Q breakfast Starleen Blue, NP   150 mg at 04/10/23 4098    Vitamin D (Ergocalciferol) (DRISDOL) 1.25 MG (50000 UNIT) capsule 50,000 Units  50,000 Units Oral Q7 days Starleen Blue, NP   50,000 Units at 04/08/23 1357   PTA Medications: Medications Prior to Admission  Medication Sig Dispense Refill Last Dose   QUEtiapine (SEROQUEL) 25 MG tablet Take 25 mg by mouth 2 (two) times daily.       Patient Stressors: Copy difficulties    Patient Strengths: Ability for insight  Active sense of humor  Communication skills  General fund of knowledge  Motivation for treatment/growth   Treatment Modalities: Medication Management, Group therapy, Case management,  1 to 1 session with clinician, Psychoeducation, Recreational therapy.   Physician Treatment Plan for Primary Diagnosis: MDD (major depressive disorder), recurrent severe, without psychosis (HCC) Long Term Goal(s): Improvement in symptoms so as ready for discharge   Short Term Goals: Ability to identify changes in lifestyle to reduce recurrence of condition will improve Ability to verbalize feelings will improve Ability to disclose and discuss suicidal ideas Ability to demonstrate self-control will improve Ability to identify and develop effective coping behaviors will improve Ability to maintain clinical measurements within normal limits will improve Compliance with prescribed medications will improve  Medication Management: Evaluate patient's response, side effects, and tolerance of medication regimen.  Therapeutic Interventions: 1 to 1 sessions, Unit Group sessions and Medication administration.  Evaluation of Outcomes: Progressing  Physician Treatment Plan for Secondary Diagnosis: Principal Problem:   MDD (major depressive disorder), recurrent severe, without psychosis (HCC) Active Problems:   Suicidal ideation   GAD (generalized anxiety disorder)   PTSD (post-traumatic stress disorder)  Long Term Goal(s): Improvement in symptoms so as ready for discharge    Short Term Goals: Ability to identify changes in lifestyle to reduce recurrence of condition will improve Ability to verbalize feelings will improve Ability to disclose and discuss suicidal ideas Ability to demonstrate self-control will improve Ability to identify and develop effective coping behaviors will improve Ability to maintain clinical measurements within normal limits will improve Compliance with prescribed medications will improve     Medication Management: Evaluate patient's response, side effects, and tolerance of medication regimen.  Therapeutic Interventions: 1 to 1 sessions, Unit Group sessions and Medication administration.  Evaluation of Outcomes: Progressing   RN Treatment Plan for Primary Diagnosis: MDD (major depressive disorder), recurrent severe, without psychosis (HCC) Long Term Goal(s): Knowledge of disease and therapeutic regimen to maintain health will improve  Short Term Goals: Ability to remain free from injury will improve, Ability to verbalize frustration and anger appropriately will improve, Ability to demonstrate self-control, Ability to participate in decision making will improve, Ability to verbalize feelings will improve, Ability to disclose and discuss suicidal ideas, Ability to identify and develop effective coping behaviors will improve, and Compliance with prescribed medications will improve  Medication Management: RN will administer medications as ordered by provider, will assess and evaluate patient's response and provide education to patient for prescribed medication. RN will report any adverse and/or side effects to prescribing provider.  Therapeutic Interventions: 1 on 1 counseling sessions, Psychoeducation, Medication administration, Evaluate responses to treatment, Monitor vital signs and CBGs as ordered, Perform/monitor CIWA, COWS, AIMS and Fall Risk screenings as ordered, Perform wound care treatments as ordered.  Evaluation of Outcomes:  Progressing   LCSW Treatment Plan for Primary Diagnosis: MDD (major depressive disorder), recurrent severe, without psychosis (HCC) Long Term Goal(s): Safe transition to appropriate next level of care at discharge, Engage patient in therapeutic group addressing interpersonal concerns.  Short Term Goals: Engage patient in aftercare planning with referrals and resources, Increase social support, Increase ability to appropriately verbalize feelings, Increase emotional regulation, Facilitate acceptance of mental health diagnosis and concerns, Facilitate patient progression through stages of change regarding substance use diagnoses and concerns, Identify triggers associated with mental health/substance abuse issues, and Increase skills for wellness and recovery  Therapeutic Interventions: Assess for all discharge needs, 1 to 1 time with Social worker, Explore available resources and support systems, Assess for adequacy in community support network, Educate family and significant other(s) on suicide prevention, Complete Psychosocial Assessment, Interpersonal group therapy.  Evaluation of Outcomes: Progressing   Progress in Treatment: Attending groups: Yes. Participating in groups: Yes. Taking medication as prescribed: Yes. Toleration medication: Yes. Family/Significant other contact made: Yes, individual(s) contacted:  -Philmore Pali (Mother) [phone].  Patient understands diagnosis: Yes. Discussing patient identified problems/goals with staff: Yes. Medical problems stabilized or resolved: Yes. Denies suicidal/homicidal ideation: Yes. Issues/concerns per patient self-inventory: Yes. Other: N/A  New problem(s) identified: No, Describe:  None reported  New Short Term/Long Term Goal(s): medication stabilization, elimination of SI thoughts, development of comprehensive mental wellness plan.   Patient Goals:  Coping Skills  Discharge Plan or Barriers: CSW  will continue to follow and  assess for appropriate referrals and possible discharge planning.     Reason for Continuation of Hospitalization: Anxiety Depression Medication stabilization Suicidal ideation  Estimated Length of Stay: 3-7 Days  Last 3 [place] Suicide Severity Risk Score: Flowsheet Row Ad[facility] (Current[facility] Most recent reading at 04/04/2023 12:00 AM ED from 04/03/20[facility] in [facility] Most recent reading at 04/03/2023  6:02 PM  C-SSRS RISK CATEGORY Low Risk Low Risk       Last PHQ 2/9 Scores:    06/30/2019    2:55 PM 05/22/2018    4:13 PM 04/22/2018    8:39 AM  Depression screen PHQ 2/9  Decreased Interest 2 2 1   Down, Depressed, Hopeless 3 1 0  PHQ - 2 Score 5 3 1   Altered sleeping 3 3 3   Tired, decreased energy 3 3 3   Change in appetite 2 0 0  Feeling bad or failure about yourself  1 1 0  Trouble concentrating 2 0 0  Moving slowly or fidgety/restless 0 0 0  Suicidal thoughts 1 0 0  PHQ-9 Score 17 10 7     medication stabilization, elimination of SI thoughts, development of comprehensive mental wellness plan.   Scribe for Treatment Team: Ane Payment, LCSW 04/10/2023 3:01 PM

## 2023-04-10 NOTE — Progress Notes (Signed)
[facility] MD Progress Note  04/10/2023 2:57 PM Charlotte Norman  MRN:  [id number]  Principal Problem: MDD (major depressive disorder), recurrent severe, without psychosis (HCC) Diagnosis: Principal Problem:   MDD (major depressive disorder), recurrent severe, without psychosis (HCC) Active Problems:   Suicidal ideation   GAD (generalized anxiety disorder)   PTSD (post-traumatic stress disorder)  Reason for Admission: Charlotte Norman is a 22 yr old female who presented to [facility] on 7/24 with worsening depression and Passive SI. PPHx is significant for Depression, Anxiety, and PTSD, and 1 Suicide Attempt (OD-2022), and no history of Self Injurious Behavior or Prior Psychiatric Hospitalizations.   24 hr chart review: Sleep Hours last night: Slept well as per patient. Good sleep reported by nursing. Nursing Concerns: none  Behavioral episodes in the past 24 hrs: none  Medication Compliance: Compliant  Vital Signs in the past 24 hrs: WNL PRN Medications in the past 24 hrs: Hydroxyzine  Patient assessment note:  On assessment today, the pt reports that their mood is euthymic, improved since admission, and stable. Denies feeling down, depressed, or sad.  Reports that anxiety symptoms are at manageable level.  Sleep is stable. Appetite is stable.  Concentration is without complaint.  Energy level is adequate. Denies having any suicidal thoughts. Denies having any suicidal intent and plan.  Denies having any HI.  Denies having psychotic symptoms; Denies AVH, denies paranoia, denies all first rank symptoms.   Denies having side effects to current psychiatric medications. No TD/EPS type symptoms found on assessment, and pt denies any feelings of stiffness. AIMS: 0.   Discussed discharge planning; CSW to call parents for safety planning and outpatient appointments for continuity of care to be made prior to discharge. As per CSW, pt already has a PHI program in place to start  after discharge. Pt is requesting that Psychiatrist speak with her mother prior to her discharge since she states that mother's reaction to her being on psychotropic medications triggered her anxiety last night. She reports that her mother told her during visitation that she had to "make my brain make my own chemicals that I need for my depression, and not take the pharmaceuticals that are being given to me. She wants me to drink some tea, do Yoga, and meditate." Attending Psychiatrist agreeable to talking to mother to educate on medications prior to discharge. Plan is to discharge patient tomorrow, 8/01.    Total Time spent with patient: 45 minutes  Past Psychiatric History: See H & P  Past Medical History: History reviewed. No pertinent past medical history. History reviewed. No pertinent surgical history. Family History:  Family History  Problem Relation Age of Onset   Asthma Other    Family Psychiatric  History: See H & P Social History:  Social History   Substance and Sexual Activity  Alcohol Use No     Social History   Substance and Sexual Activity  Drug Use Not Currently   Types: Marijuana    Social History   Socioeconomic History   Marital status: Single    Spouse name: Not on file   Number of children: Not on file   Years of education: Not on file   Highest education level: Not on file  Occupational History   Not on file  Tobacco Use   Smoking status: Never   Smokeless tobacco: Never  Vaping Use   Vaping status: Every Day  Substance and Sexual Activity   Alcohol use: No   Drug use: Not Currently  Types: Marijuana   Sexual activity: Not Currently    Partners: Male  Other Topics Concern   Not on file  Social History Narrative   Not on file   Social Determinants of Health   Financial Resource Strain: Not on file  Food Insecurity: Food Insecurity Present (04/04/2023)   Hunger Vital Sign    Worried About Running Out of Food in the Last Year: Sometimes true     Ran Out of Food in the Last Year: Sometimes true  Transportation Needs: No Transportation Needs (04/04/2023)   PRAPARE - Administrator, Civil Service (Medical): No    Lack of Transportation (Non-Medical): No  Physical Activity: Not on file  Stress: Not on file  Social Connections: Not on file  Sleep: Good  Appetite:  Fair  Current Medications: Current Facility-Administered Medications  Medication Dose Route Frequency Provider Last Rate Last Admin   acetaminophen (TYLENOL) tablet 650 mg  650 mg Oral Q6H PRN Sunday Corn, NP   650 mg at 04/08/23 2115   alum & mag hydroxide-simeth (MAALOX/MYLANTA) 200-200-20 MG/5ML suspension 30 mL  30 mL Oral Q4H PRN Sunday Corn, NP       diphenhydrAMINE (BENADRYL) capsule 50 mg  50 mg Oral TID PRN Sunday Corn, NP       Or   diphenhydrAMINE (BENADRYL) injection 50 mg  50 mg Intramuscular TID PRN Sunday Corn, NP       haloperidol (HALDOL) tablet 5 mg  5 mg Oral TID PRN Sunday Corn, NP       Or   haloperidol lactate (HALDOL) injection 5 mg  5 mg Intramuscular TID PRN Sunday Corn, NP       hydrOXYzine (ATARAX) tablet 25 mg  25 mg Oral TID PRN Sunday Corn, NP   25 mg at 04/09/23 2058   LORazepam (ATIVAN) tablet 2 mg  2 mg Oral TID PRN Sunday Corn, NP       Or   LORazepam (ATIVAN) injection 2 mg  2 mg Intramuscular TID PRN Sunday Corn, NP       magnesium hydroxide (MILK OF MAGNESIA) suspension 30 mL  30 mL Oral Daily PRN Sunday Corn, NP       propranolol (INDERAL) tablet 5 mg  5 mg Oral BID Starleen Blue, NP   5 mg at 04/10/23 0754   QUEtiapine (SEROQUEL XR) 24 hr tablet 50 mg  50 mg Oral QHS Sunday Corn, NP   50 mg at 04/09/23 2056   venlafaxine XR (EFFEXOR-XR) 24 hr capsule 150 mg  150 mg Oral Q breakfast Starleen Blue, NP   150 mg at 04/10/23 2536   Vitamin D (Ergocalciferol) (DRISDOL) 1.25 MG (50000 UNIT) capsule 50,000 Units  50,000 Units Oral Q7 days Starleen Blue, NP    50,000 Units at 04/08/23 1357    Lab Results:  No results found for this or any previous visit (from the past 48 hour(s)).   Blood Alcohol level:  Lab Results  Component Value Date   ETH <10 04/03/2023    Metabolic Disorder Labs: Lab Results  Component Value Date   HGBA1C 5.2 04/03/2023   MPG 102.54 04/03/2023   Lab Results  Component Value Date   PROLACTIN 12.0 04/03/2023   Lab Results  Component Value Date   CHOL 202 (H) 04/07/2023   TRIG 38 04/07/2023   HDL 72 04/07/2023   CHOLHDL 2.8 04/07/2023   VLDL 8 04/07/2023  LDLCALC 122 (H) 04/07/2023   LDLCALC 140 (H) 04/03/2023    Physical Findings: AIMS:  , ,  ,  ,    CIWA:    COWS:     Musculoskeletal: Strength & Muscle Tone: within normal limits Gait & Station: normal Patient leans: N/A  Psychiatric Specialty Exam:  Presentation  General Appearance:  Appropriate for Environment  Eye Contact: Fair  Speech: Clear and Coherent  Speech Volume: Normal  Handedness: Right   Mood and Affect  Mood: Euthymic  Affect: Congruent; Appropriate   Thought Process  Thought Processes: Coherent  Descriptions of Associations:Intact  Orientation:Full (Time, Place and Person)  Thought Content:Logical  History of Schizophrenia/Schizoaffective disorder:No  Duration of Psychotic Symptoms:No data recorded Hallucinations:Hallucinations: None  Ideas of Reference:None  Suicidal Thoughts:Suicidal Thoughts: No  Homicidal Thoughts:Homicidal Thoughts: No   Sensorium  Memory: Immediate Good  Judgment: Good  Insight: Good   Executive Functions  Concentration: Good  Attention Span: Good  Recall: Good  Fund of Knowledge: Good  Language: Good   Psychomotor Activity  Psychomotor Activity: Psychomotor Activity: Normal   Assets  Assets: Communication Skills   Sleep  Sleep: Sleep: Good    Physical Exam: Physical Exam Constitutional:      Appearance: Normal appearance.   HENT:     Head: Normocephalic.     Nose: No congestion.  Eyes:     Pupils: Pupils are equal, round, and reactive to light.  Pulmonary:     Effort: Pulmonary effort is normal.  Musculoskeletal:     Cervical back: Normal range of motion.  Neurological:     General: No focal deficit present.     Mental Status: She is alert and oriented to person, place, and time.    Review of Systems  Constitutional:  Negative for fever.  HENT:  Negative for hearing loss.   Eyes:  Negative for blurred vision.  Respiratory:  Negative for cough.   Cardiovascular:  Negative for chest pain.  Gastrointestinal:  Negative for heartburn.  Genitourinary:  Negative for dysuria.  Musculoskeletal:  Negative for myalgias.  Skin:  Negative for rash.  Neurological:  Negative for dizziness.  Psychiatric/Behavioral:  Positive for depression and substance abuse. Negative for hallucinations, memory loss and suicidal ideas. The patient is nervous/anxious and has insomnia.    Blood pressure 117/86, pulse 96, temperature 98 F (36.7 C), temperature source Oral, resp. rate 18, height 5\' 10"  (1.778 m), weight 51.7 kg, SpO2 100%. Body mass index is 16.36 kg/m.  Treatment Plan Summary: Daily contact with patient to assess and evaluate symptoms and progress in treatment and Medication management  Safety and Monitoring: Voluntary admission to inpatient psychiatric unit for safety, stabilization and treatment Daily contact with patient to assess and evaluate symptoms and progress in treatment Patient's case to be discussed in multi-disciplinary team meeting Observation Level : q15 minute checks Vital signs: q12 hours Precautions: Safety  Long Term Goal(s): Improvement in symptoms so as ready for discharge  Short Term Goals: Ability to identify changes in lifestyle to reduce recurrence of condition will improve, Ability to verbalize feelings will improve, Ability to disclose and discuss suicidal ideas, Ability to  demonstrate self-control will improve, Ability to identify and develop effective coping behaviors will improve, Ability to maintain clinical measurements within normal limits will improve, and Compliance with prescribed medications will improve  Diagnoses Principal Problem:   MDD (major depressive disorder), recurrent severe, without psychosis (HCC) Active Problems:   Suicidal ideation   GAD (generalized anxiety disorder)  PTSD (post-traumatic stress disorder)  Medications -Continue Inderal 0.5 mg BID for tachycardia & GAD -Continue Vitamin D 50.000 units daily for low Vitamin D of 11.71 -Continue Effexor XR 150 mg daily for depression & anxiety  -Continue Seroquel XR 50 mg at bedtime -Continue Hydroxyzine 25 mg TID PRN for anxiety -Continue Agitation Protocol: Haldol/Ativan/Benadryl (See MAR for details)  Other PRNS -Continue Tylenol 650 mg every 6 hours PRN for mild pain -Continue Maalox 30 mg every 4 hrs PRN for indigestion -Continue Milk of Magnesia as needed every 6 hrs for constipation  Labs Reviewed: Low Neutrophils on admission, and still low on repeat, WBCs improved with repeat. Repeat CBC continues to show neutrophil counts as low. Will recommend outpatient f/u at discharge.   Discharge Planning: Social work and case management to assist with discharge planning and identification of hospital follow-up needs prior to discharge Estimated LOS: 5-7 days Discharge Concerns: Need to establish a safety plan; Medication compliance and effectiveness Discharge Goals: Return home with outpatient referrals for mental health follow-up including medication management/psychotherapy  I certify that inpatient services furnished can reasonably be expected to improve the patient's condition.    Starleen Blue, NP 7/31/20242:57 PM    Patient ID: Charlotte Norman, female   DOB: 2000-11-19, 22 y.o.   MRN: [id number]

## 2023-04-10 NOTE — Progress Notes (Signed)
   04/09/23 2000  Psychosocial Assessment  Patient Complaints Anxiety;Depression (Rates anxiety 7/10 and depression 4/10 with 10 being the worse.)  Eye Contact Fair  Facial Expression Anxious  Affect Anxious;Depressed  Speech Logical/coherent  Interaction Cautious  Motor Activity Other (Comment) (WNL)  Appearance/Hygiene Unremarkable  Behavior Characteristics Cooperative;Anxious  Mood Depressed;Anxious;Pleasant  Thought Process  Coherency WDL  Content WDL  Delusions None reported or observed  Perception WDL  Hallucination None reported or observed  Judgment Limited  Confusion None  Danger to Self  Current suicidal ideation? Denies  Self-Injurious Behavior No self-injurious ideation or behavior indicators observed or expressed   Danger to Others  Danger to Others None reported or observed

## 2023-04-10 NOTE — [facility] Group Notes (Signed)
[facility] Group Notes:  (Nursing/MHT/Case Management/Adjunct)  Date:  04/10/2023  Time:  2000 Type of Therapy:   Narcotics Anonymous Meeting  Participation Level:  Active  Participation Quality:  Appropriate, Attentive, Sharing, and Supportive  Affect:  Appropriate  Cognitive:  Alert  Insight:  Improving  Engagement in Group:  Engaged  Modes of Intervention:  Education and Support  Summary of Progress/Problems:  Marcille Buffy 04/10/2023, 9:36 PM

## 2023-04-11 ENCOUNTER — Telehealth (HOSPITAL_COMMUNITY): Payer: Self-pay | Admitting: Licensed Clinical Social Worker

## 2023-04-11 ENCOUNTER — Ambulatory Visit (HOSPITAL_COMMUNITY): Payer: Medicaid Other

## 2023-04-11 MED ORDER — PROPRANOLOL HCL 10 MG PO TABS: 5.0000 mg | ORAL_TABLET | Freq: Two times a day (BID) | ORAL | 0 refills | Status: AC

## 2023-04-11 MED ORDER — VENLAFAXINE HCL ER 150 MG PO CP24: 150.0000 mg | ORAL_CAPSULE | Freq: Every day | ORAL | 0 refills | Status: AC

## 2023-04-11 MED ORDER — QUETIAPINE FUMARATE ER 50 MG PO TB24: 50.0000 mg | ORAL_TABLET | Freq: Every day | ORAL | 0 refills | Status: AC

## 2023-04-11 MED ORDER — MELATONIN 3 MG PO TABS
3.0000 mg | ORAL_TABLET | Freq: Once | ORAL | Status: AC
  Administered 2023-04-11: 3 mg via ORAL
  Filled 2023-04-11 (×2): qty 1

## 2023-04-11 MED ORDER — HYDROXYZINE HCL 25 MG PO TABS: 25.0000 mg | ORAL_TABLET | Freq: Three times a day (TID) | ORAL | 0 refills | Status: AC | PRN

## 2023-04-11 MED ORDER — VITAMIN D (ERGOCALCIFEROL) 1.25 MG (50000 UNIT) PO CAPS: 50000.0000 [IU] | ORAL_CAPSULE | ORAL | 0 refills | Status: AC

## 2023-04-11 NOTE — H&P (Signed)
I spoke with pt's mother (name and number in epic) just now. We discussed the pt's diagnosis, hospital course, treatment, response to treatment, and discharge planning.  At the end of the call, the caller had no further questions.  We also discussed safety planning including securing medications and any firearms at home - mother was agreeable.

## 2023-04-11 NOTE — Group Note (Signed)
Date:  04/11/2023 Time:  12:03 PM  Group Topic/Focus:  Goals Group:   The focus of this group is to help patients establish daily goals to achieve during treatment and discuss how the patient can incorporate goal setting into their daily lives to aide in recovery.    Participation Level:  Active  Participation Quality:  Appropriate  Affect:  Appropriate  Cognitive:  Alert  Insight: Appropriate  Engagement in Group:  Engaged  Modes of Intervention:  Discussion  Additional Comments:    Beckie Busing 04/11/2023, 12:03 PM

## 2023-04-11 NOTE — Plan of Care (Signed)
  Problem: Coping: Goal: Ability to verbalize frustrations and anger appropriately will improve Outcome: Progressing Goal: Ability to demonstrate self-control will improve Outcome: Progressing   Problem: Health Behavior/Discharge Planning: Goal: Identification of resources available to assist in meeting health care needs will improve Outcome: Progressing Goal: Compliance with treatment plan for underlying cause of condition will improve Outcome: Progressing   

## 2023-04-11 NOTE — Telephone Encounter (Signed)
11:05 am: Cln called [facility] adult unit to determine if pt is still admitted, as pt still appears on the census and did not sign onto Teams for scheduled appt. Cln informed by adult unit staff that pt is still on the unit and it is unclear when pt will be leaving. Cln informed CSW Joy Buford and scheduler Gennette Pac via secure chat that pt's PHP CCA will be moved to 8/6 at 1 pm, as that is the next available CCA slot. Cln requested pt be informed of this in-person and for her AVS to be updated to reflect the rescheduled date and time.

## 2023-04-11 NOTE — Discharge Summary (Addendum)
Physician Discharge Summary Note  Patient:  Charlotte Norman is an 22 y.o., female MRN:  [id number] DOB:  [birth year] Patient phone:  [phone] (home)  Patient address:   [address] [place] [address],  Total Time spent with patient: 20 minutes  Date of Admission:  04/03/2023 Date of Discharge: 04-11-2023  Reason for Admission:   Charlotte Norman is a 22 yr old female who presented to [facility] on 7/24 with worsening depression and Passive SI. PPHx is significant for Depression, Anxiety, and PTSD, and 1 Suicide Attempt (OD-2022), and no history of Self Injurious Behavior or Prior Psychiatric Hospitalizations.   Principal Problem: MDD (major depressive disorder), recurrent severe, without psychosis (HCC) Discharge Diagnoses: Principal Problem:   MDD (major depressive disorder), recurrent severe, without psychosis (HCC) Active Problems:   Suicidal ideation   GAD (generalized anxiety disorder)   PTSD (post-traumatic stress disorder)   Past Psychiatric History:  Depression, Anxiety, and PTSD, and 1 Suicide Attempt (OD-2022), and no history of Self Injurious Behavior or Prior Psychiatric Hospitalizations.   Past Medical History: History reviewed. No pertinent past medical history. History reviewed. No pertinent surgical history. Family History:  Family History  Problem Relation Age of Onset   Asthma Other    Family Psychiatric  History: See H&P  Social History:  Social History   Substance and Sexual Activity  Alcohol Use No     Social History   Substance and Sexual Activity  Drug Use Not Currently   Types: Marijuana    Social History   Socioeconomic History   Marital status: Single    Spouse name: Not on file   Number of children: Not on file   Years of education: Not on file   Highest education level: Not on file  Occupational History   Not on file  Tobacco Use   Smoking status: Never   Smokeless tobacco: Never  Vaping Use    Vaping status: Every Day  Substance and Sexual Activity   Alcohol use: No   Drug use: Not Currently    Types: Marijuana   Sexual activity: Not Currently    Partners: Male  Other Topics Concern   Not on file  Social History Narrative   Not on file   Social Determinants of Health   Financial Resource Strain: Not on file  Food Insecurity: Food Insecurity Present (04/04/2023)   Hunger Vital Sign    Worried About Running Out of Food in the Last Year: Sometimes true    Ran Out of Food in the Last Year: Sometimes true  Transportation Needs: No Transportation Needs (04/04/2023)   PRAPARE - Administrator, Civil Service (Medical): No    Lack of Transportation (Non-Medical): No  Physical Activity: Not on file  Stress: Not on file  Social Connections: Not on file    Hospital Course:   During the patient's hospitalization, patient had extensive initial psychiatric evaluation, and follow-up psychiatric evaluations every day.  Psychiatric diagnoses provided upon initial assessment:  MDD, Recurrent, Severe w/out Psychosis GAD  PTSD   Patient's psychiatric medications were adjusted on admission:  -Start Effexor XR 37.5 mg daily for depression and anxiety -Continue Seroquel XR 50 mg QHS  During the hospitalization, other adjustments were made to the patient's psychiatric medication regimen:  -effexor incr to 150 mg every day  -propranolol 5 mg bid started for anxiety and tachycardia  Patient's care was discussed during the interdisciplinary team meeting every day during the hospitalization.  The patient denied having side  effects to prescribed psychiatric medication.  Gradually, patient started adjusting to milieu. The patient was evaluated each day by a clinical provider to ascertain response to treatment. Improvement was noted by the patient's report of decreasing symptoms, improved sleep and appetite, affect, medication tolerance, behavior, and participation in unit  programming.  Patient was asked each day to complete a self inventory noting mood, mental status, pain, new symptoms, anxiety and concerns.    Symptoms were reported as significantly decreased or resolved completely by discharge.   On day of discharge, the patient reports that their mood is stable. The patient denied having suicidal thoughts for more than 48 hours prior to discharge.  Patient denies having homicidal thoughts.  Patient denies having auditory hallucinations.  Patient denies any visual hallucinations or other symptoms of psychosis. The patient was motivated to continue taking medication with a goal of continued improvement in mental health.   The patient reports their target psychiatric symptoms of depression and suicidal thoughts, responded well to the psychiatric medications, and the patient reports overall benefit other psychiatric hospitalization. Supportive psychotherapy was provided to the patient. The patient also participated in regular group therapy while hospitalized. Coping skills, problem solving as well as relaxation therapies were also part of the unit programming.  Labs were reviewed with the patient, and abnormal results were discussed with the patient.  The patient is able to verbalize their individual safety plan to this provider.  # It is recommended to the patient to continue psychiatric medications as prescribed, after discharge from the hospital.    # It is recommended to the patient to follow up with your outpatient psychiatric provider and PCP.  # It was discussed with the patient, the impact of alcohol, drugs, tobacco have been there overall psychiatric and medical wellbeing, and total abstinence from substance use was recommended the patient.ed.  # Prescriptions provided or sent directly to preferred pharmacy at discharge. Patient agreeable to plan. Given opportunity to ask questions. Appears to feel comfortable with discharge.    # In the event of  worsening symptoms, the patient is instructed to call the crisis hotline, 911 and or go to the nearest ED for appropriate evaluation and treatment of symptoms. To follow-up with primary care provider for other medical issues, concerns and or health care needs  # Patient was discharged to care of mother, with a plan to follow up as noted below.   Physical Findings: AIMS:  , ,  ,  ,    CIWA:    COWS:     Aims score zero on my exam. No eps on my exam.   Musculoskeletal: Strength & Muscle Tone: within normal limits Gait & Station: normal Patient leans: N/A   Psychiatric Specialty Exam:  Presentation  General Appearance:  Appropriate for Environment; Casual; Fairly Groomed  Eye Contact: Good  Speech: Normal Rate; Clear and Coherent  Speech Volume: Normal  Handedness: Right   Mood and Affect  Mood: Euthymic  Affect: Appropriate; Congruent; Full Range   Thought Process  Thought Processes: Linear  Descriptions of Associations:Intact  Orientation:Full (Time, Place and Person)  Thought Content:Logical  History of Schizophrenia/Schizoaffective disorder:No  Duration of Psychotic Symptoms:No data recorded Hallucinations:Hallucinations: None  Ideas of Reference:None  Suicidal Thoughts:Suicidal Thoughts: No  Homicidal Thoughts:Homicidal Thoughts: No   Sensorium  Memory: Immediate Good; Recent Good; Remote Good  Judgment: Good  Insight: Good   Executive Functions  Concentration: Good  Attention Span: Good  Recall: Good  Fund of Knowledge: Good  Language:  Good   Psychomotor Activity  Psychomotor Activity: Psychomotor Activity: Normal   Assets  Assets: Communication Skills   Sleep  Sleep: Sleep: Fair    Physical Exam: Physical Exam Vitals reviewed.  Constitutional:      Appearance: She is normal weight.  Pulmonary:     Effort: Pulmonary effort is normal.  Neurological:     Mental Status: She is alert.     Motor: No  weakness.     Gait: Gait normal.  Psychiatric:        Mood and Affect: Mood normal.        Behavior: Behavior normal.        Thought Content: Thought content normal.        Judgment: Judgment normal.    Review of Systems  Constitutional:  Negative for chills and fever.  Cardiovascular:  Negative for chest pain and palpitations.  Neurological:  Negative for dizziness, tingling, tremors and headaches.  Psychiatric/Behavioral:  Negative for depression, hallucinations, memory loss, substance abuse and suicidal ideas. The patient is not nervous/anxious and does not have insomnia.   All other systems reviewed and are negative.  Blood pressure 120/77, pulse 97, temperature 97.6 F (36.4 C), resp. rate 18, height 5\' 10"  (1.778 m), weight 51.7 kg, SpO2 100%. Body mass index is 16.36 kg/m.   Social History   Tobacco Use  Smoking Status Never  Smokeless Tobacco Never   Tobacco Cessation:  N/A, patient does not currently use tobacco products   Blood Alcohol level:  Lab Results  Component Value Date   ETH <10 04/03/2023    Metabolic Disorder Labs:  Lab Results  Component Value Date   HGBA1C 5.2 04/03/2023   MPG 102.54 04/03/2023   Lab Results  Component Value Date   PROLACTIN 12.0 04/03/2023   Lab Results  Component Value Date   CHOL 202 (H) 04/07/2023   TRIG 38 04/07/2023   HDL 72 04/07/2023   CHOLHDL 2.8 04/07/2023   VLDL 8 04/07/2023   LDLCALC 122 (H) 04/07/2023   LDLCALC 140 (H) 04/03/2023    See Psychiatric Specialty Exam and Suicide Risk Assessment completed by Attending Physician prior to discharge.  Discharge destination:  Home  Is patient on multiple antipsychotic therapies at discharge:  No   Has Patient had three or more failed trials of antipsychotic monotherapy by history:  No  Recommended Plan for Multiple Antipsychotic Therapies: NA  Discharge Instructions     Diet - low sodium heart healthy   Complete by: As directed    Increase activity  slowly   Complete by: As directed       Allergies as of 04/11/2023       Reactions   Penicillins         Medication List     STOP taking these medications    QUEtiapine 25 MG tablet Commonly known as: SEROQUEL Replaced by: QUEtiapine 50 MG Tb24 24 hr tablet       TAKE these medications      Indication  hydrOXYzine 25 MG tablet Commonly known as: ATARAX Take 1 tablet (25 mg total) by mouth 3 (three) times daily as needed for anxiety.  Indication: Feeling Anxious   propranolol 10 MG tablet Commonly known as: INDERAL Take 0.5 tablets (5 mg total) by mouth 2 (two) times daily.  Indication: Feeling Anxious, tachycardia & anxiety   QUEtiapine 50 MG Tb24 24 hr tablet Commonly known as: SEROQUEL XR Take 1 tablet (50 mg total) by mouth at bedtime. Replaces:  QUEtiapine 25 MG tablet  Indication: Trouble Sleeping, Major Depressive Disorder   venlafaxine XR 150 MG 24 hr capsule Commonly known as: EFFEXOR-XR Take 1 capsule (150 mg total) by mouth daily with breakfast. Start taking on: April 12, 2023  Indication: Generalized Anxiety Disorder, Major Depressive Disorder   Vitamin D (Ergocalciferol) 1.25 MG (50000 UNIT) Caps capsule Commonly known as: DRISDOL Take 1 capsule (50,000 Units total) by mouth every 7 (seven) days for 4 doses. Start taking on: April 15, 2023  Indication: Vitamin D Deficiency        Follow-up Information     [facility] Follow up.   Specialty: Behavioral Health Why: You may also go to this provider for an assessment to obtain therapy and/or medication management services on Monday through Friday, arrive at 7:00 am. Contact information: [address] [place] [address] [phone]        [facility] Follow up on 04/11/2023.   Why: You have an appointment for medication management services on 04/11/23 at 5:00 pm.  This will be a Virtual video visit.  Please have your discharge summary  information available for this appointment. Contact information: [address] [place], [place] [address]  Phone: [phone] Fax:  [phone]        [facility]. Schedule an appointment as soon as possible for a visit.   Why: Please contact your provider to schedule an appointment for therapy services as we were unable to do so prior to your discharge. Contact information: [address] [place], [place] [address]  Phone: [phone]        [facility] Follow up on 04/11/2023.   Specialty: Behavioral Health Why: You are scheduled for an assessment for the PHP on 04/11/23 @ 10:00 am. This appointment will last approximately one hour and will be virtual via [employer] PHP is virtual group therapy that runs Mon-Fri from 9am-1pm. Please download the [employer] Teams app prior to the appointment. If you need to cancel or reschedule, please call [phone] Contact information: [address] [place] [address] [phone]                Follow-up recommendations:    Activity: as tolerated  Diet: heart healthy  Other: -Follow-up with your outpatient psychiatric provider -instructions on appointment date, time, and address (location) are provided to you in discharge paperwork.  -Take your psychiatric medications as prescribed at discharge - instructions are provided to you in the discharge paperwork  -Follow-up with outpatient primary care doctor and other specialists -for management of preventative medicine and chronic medical disease and low ANC  -If you are prescribed an atypical antipsychotic medication, we recommend that your outpatient psychiatrist follow routine screening for side effects within 3 months of discharge, including monitoring: AIMS scale, height, weight, blood pressure, fasting lipid panel, HbA1c, and fasting blood sugar.   -Recommend total abstinence from alcohol, tobacco, and other  illicit drug use at discharge.   -If your psychiatric symptoms recur, worsen, or if you have side effects to your psychiatric medications, call your outpatient psychiatric provider, 911, [phone] or go to the nearest emergency department.  -If suicidal thoughts occur, immediately call your outpatient psychiatric provider, 911, [phone] or go to the nearest emergency department.    Signed: Cristy Hilts, MD 04/11/2023, 9:07 AM  Total Time Spent in Direct Patient Care:  I personally spent 35 minutes on the unit in direct patient care.  The direct patient care time included face-to-face time with the patient, reviewing the patient's chart, communicating with other professionals, and coordinating care. Greater than 50% of this time was spent in counseling or coordinating care with the patient regarding goals of hospitalization, psycho-education, and discharge planning needs.   Phineas Inches, MD Psychiatrist

## 2023-04-11 NOTE — Progress Notes (Signed)
Discharge Note:  Patient discharged to lobby. Patient denied SI and HI. Denied A/V hallucinations. Suicide prevention information given and discussed with patient who stated they understood and had no questions. Patient stated they received all their belongings, clothing, toiletries, misc items, etc. Patient stated they appreciated all assistance received from [facility] staff. All required discharge information given to patient. 

## 2023-04-11 NOTE — Group Note (Signed)
LCSW Group Therapy Note  Group Date: 04/11/2023 Start Time: 1100 End Time: 1145   Type of Therapy and Topic:  Group Therapy - Healthy vs Unhealthy Coping Skills  Participation Level:  Active   Description of Group The focus of this group was to determine what unhealthy coping techniques typically are used by group members and what healthy coping techniques would be helpful in coping with various problems. Patients were guided in becoming aware of the differences between healthy and unhealthy coping techniques. Patients were asked to identify 2-3 healthy coping skills they would like to learn to use more effectively.  Therapeutic Goals Patients learned that coping is what human beings do all day long to deal with various situations in their lives Patients defined and discussed healthy vs unhealthy coping techniques Patients identified their preferred coping techniques and identified whether these were healthy or unhealthy Patients determined 2-3 healthy coping skills they would like to become more familiar with and use more often. Patients provided support and ideas to each other   Summary of Patient Progress:  During group, patient expressed interest. Patient proved open to input from peers and feedback from CSW. Patient demonstrated input and  insight into the subject matter, was respectful of peers, and participated throughout the entire session.   Therapeutic Modalities Cognitive Behavioral Therapy Motivational Interviewing  Charlotte Norman 04/11/2023  12:29 PM

## 2023-04-11 NOTE — Progress Notes (Signed)
Patient awake c/o seeing a bug in her room and roommate snoring and not able to sleep, requesting for sleeping aid. Provider notified. Melatonin 3mg  PO given. Patient was reassured. Support and encouragement provided.

## 2023-04-11 NOTE — [facility] Suicide Risk Assessment (Signed)
[facility] Discharge Suicide Risk Assessment   Principal Problem: MDD (major depressive disorder), recurrent severe, without psychosis (HCC) Discharge Diagnoses: Principal Problem:   MDD (major depressive disorder), recurrent severe, without psychosis (HCC) Active Problems:   Suicidal ideation   GAD (generalized anxiety disorder)   PTSD (post-traumatic stress disorder)   Total Time spent with patient: 20 minutes  Charlotte Norman is a 22 yr old female who presented to [facility] on 7/24 with worsening depression and Passive SI. PPHx is significant for Depression, Anxiety, and PTSD, and 1 Suicide Attempt (OD-2022), and no history of Self Injurious Behavior or Prior Psychiatric Hospitalizations.   During the patient's hospitalization, patient had extensive initial psychiatric evaluation, and follow-up psychiatric evaluations every day.   Psychiatric diagnoses provided upon initial assessment:  MDD, Recurrent, Severe w/out Psychosis GAD  PTSD    Patient's psychiatric medications were adjusted on admission:  -Start Effexor XR 37.5 mg daily for depression and anxiety -Continue Seroquel XR 50 mg QHS   During the hospitalization, other adjustments were made to the patient's psychiatric medication regimen:  -effexor incr to 150 mg every day  -propranolol 5 mg bid started for anxiety and tachycardia   Patient's care was discussed during the interdisciplinary team meeting every day during the hospitalization.   The patient denied having side effects to prescribed psychiatric medication.   Gradually, patient started adjusting to milieu. The patient was evaluated each day by a clinical provider to ascertain response to treatment. Improvement was noted by the patient's report of decreasing symptoms, improved sleep and appetite, affect, medication tolerance, behavior, and participation in unit programming.  Patient was asked each day to complete a self inventory noting mood, mental status,  pain, new symptoms, anxiety and concerns.     Symptoms were reported as significantly decreased or resolved completely by discharge.    On day of discharge, the patient reports that their mood is stable. The patient denied having suicidal thoughts for more than 48 hours prior to discharge.  Patient denies having homicidal thoughts.  Patient denies having auditory hallucinations.  Patient denies any visual hallucinations or other symptoms of psychosis. The patient was motivated to continue taking medication with a goal of continued improvement in mental health.    The patient reports their target psychiatric symptoms of depression and suicidal thoughts, responded well to the psychiatric medications, and the patient reports overall benefit other psychiatric hospitalization. Supportive psychotherapy was provided to the patient. The patient also participated in regular group therapy while hospitalized. Coping skills, problem solving as well as relaxation therapies were also part of the unit programming.   Labs were reviewed with the patient, and abnormal results were discussed with the patient.   The patient is able to verbalize their individual safety plan to this provider.   # It is recommended to the patient to continue psychiatric medications as prescribed, after discharge from the hospital.     # It is recommended to the patient to follow up with your outpatient psychiatric provider and PCP.   # It was discussed with the patient, the impact of alcohol, drugs, tobacco have been there overall psychiatric and medical wellbeing, and total abstinence from substance use was recommended the patient.ed.   # Prescriptions provided or sent directly to preferred pharmacy at discharge. Patient agreeable to plan. Given opportunity to ask questions. Appears to feel comfortable with discharge.    # In the event of worsening symptoms, the patient is instructed to call the crisis hotline, 911 and or go  to the  nearest ED for appropriate evaluation and treatment of symptoms. To follow-up with primary care provider for other medical issues, concerns and or health care needs   # Patient was discharged to care of mother, with a plan to follow up as noted below.    Psychiatric Specialty Exam  Presentation  General Appearance:  Appropriate for Environment; Casual; Fairly Groomed  Eye Contact: Good  Speech: Normal Rate; Clear and Coherent  Speech Volume: Normal  Handedness: Right   Mood and Affect  Mood: Euthymic  Duration of Depression Symptoms: Greater than two weeks  Affect: Appropriate; Congruent; Full Range   Thought Process  Thought Processes: Linear  Descriptions of Associations:Intact  Orientation:Full (Time, Place and Person)  Thought Content:Logical  History of Schizophrenia/Schizoaffective disorder:No  Duration of Psychotic Symptoms:No data recorded Hallucinations:Hallucinations: None  Ideas of Reference:None  Suicidal Thoughts:Suicidal Thoughts: No  Homicidal Thoughts:Homicidal Thoughts: No   Sensorium  Memory: Immediate Good; Recent Good; Remote Good  Judgment: Good  Insight: Good   Executive Functions  Concentration: Good  Attention Span: Good  Recall: Good  Fund of Knowledge: Good  Language: Good   Psychomotor Activity  Psychomotor Activity: Psychomotor Activity: Normal   Assets  Assets: Communication Skills   Sleep  Sleep: Sleep: Fair   Physical Exam: Physical Exam See discharge summary  ROS See discharge summary  Blood pressure 120/77, pulse 97, temperature 97.6 F (36.4 C), resp. rate 18, height 5\' 10"  (1.778 m), weight 51.7 kg, SpO2 100%. Body mass index is 16.36 kg/m.  Mental Status Per Nursing Assessment::   On Admission:  NA  Demographic factors:  Adolescent or young adult, Unemployed Loss Factors:  Financial problems / change in socioeconomic status Historical Factors:  Family history of mental  illness or substance abuse, Impulsivity Risk Reduction Factors:  NA  Continued Clinical Symptoms:  Mood is stable. Denying SI.   Cognitive Features That Contribute To Risk:  None    Suicide Risk:  Mild:  There are no identifiable suicide plans, no associated intent, mild dysphoria and related symptoms, good self-control (both objective and subjective assessment), few other risk factors, and identifiable protective factors, including available and accessible social support.    Follow-up Information     [facility] Follow up.   Specialty: Behavioral Health Why: You may also go to this provider for an assessment to obtain therapy and/or medication management services on Monday through Friday, arrive at 7:00 am. Contact information: [address] [place] [address] [phone]        [facility] Follow up on 04/11/2023.   Why: You have an appointment for medication management services on 04/11/23 at 5:00 pm.  This will be a Virtual video visit.  Please have your discharge summary information available for this appointment. Contact information: [address] [place], [place] [address]  Phone: [phone] Fax:  [phone]        [facility]. Schedule an appointment as soon as possible for a visit.   Why: Please contact your provider to schedule an appointment for therapy services as we were unable to do so prior to your discharge. Contact information: [address] [place], [place] [address]  Phone: [phone]        [facility] Follow up on 04/11/2023.   Specialty: Behavioral Health Why: You are scheduled for an assessment for the PHP on 04/11/23 @ 10:00 am. This appointment will last approximately one hour and  will be virtual via [employer] PHP is virtual group therapy that runs Mon-Fri from 9am-1pm. Please download the [employer] Teams app prior to the  appointment. If you need to cancel or reschedule, please call [phone] Contact information: [address] [place] [address] [phone]                Plan Of Care/Follow-up recommendations:   -Follow-up with your outpatient psychiatric provider -instructions on appointment date, time, and address (location) are provided to you in discharge paperwork.   -Take your psychiatric medications as prescribed at discharge - instructions are provided to you in the discharge paperwork   -Follow-up with outpatient primary care doctor and other specialists -for management of preventative medicine and chronic medical disease and low ANC   -If you are prescribed an atypical antipsychotic medication, we recommend that your outpatient psychiatrist follow routine screening for side effects within 3 months of discharge, including monitoring: AIMS scale, height, weight, blood pressure, fasting lipid panel, HbA1c, and fasting blood sugar.    -Recommend total abstinence from alcohol, tobacco, and other illicit drug use at discharge.    -If your psychiatric symptoms recur, worsen, or if you have side effects to your psychiatric medications, call your outpatient psychiatric provider, 911, [phone] or go to the nearest emergency department.   -If suicidal thoughts occur, immediately call your outpatient psychiatric provider, 911, 988 or go to the nearest emergency department.    Cristy Hilts, MD 04/11/2023, 9:12 AM

## 2023-04-11 NOTE — Discharge Instructions (Addendum)
-  Follow-up with your outpatient psychiatric provider -instructions on appointment date, time, and address (location) are provided to you in discharge paperwork.  -Take your psychiatric medications as prescribed at discharge - instructions are provided to you in the discharge paperwork  -Follow-up with outpatient primary care doctor and other specialists -for management of preventative medicine and any chronic medical disease. Follow-up with PCP for low neutrophil count  -Recommend abstinence from alcohol, tobacco, and other illicit drug use at discharge.   -If your psychiatric symptoms recur, worsen, or if you have side effects to your psychiatric medications, call your outpatient psychiatric provider, 911, 988 or go to the nearest emergency department.  -If suicidal thoughts occur, call your outpatient psychiatric provider, 911, 988 or go to the nearest emergency department.  Naloxone (Narcan) can help reverse an overdose when given to the victim quickly.  [facility] offers free naloxone kits and instructions/training on its use.  Add naloxone to your first aid kit and you can help save a life.   Pick up your free kit at the following locations:   [place]:  [facility], [address] [place] [address] [phone]) [facility] [address] [place] [address] [phone]) [facility] [address] [place] [address]  [place]: [facility] [address] [place] [address] ([phone]) [facility] [address] [place] [address] [phone])

## 2023-04-11 NOTE — Group Note (Signed)
Date:  04/11/2023 Time:  12:08 PM  Group Topic/Focus:  Orientation:   The focus of this group is to educate the patient on the purpose and policies of crisis stabilization and provide a format to answer questions about their admission.  The group details unit policies and expectations of patients while admitted.    Participation Level:  Active  Participation Quality:  Appropriate  Affect:  Appropriate  Cognitive:  Appropriate  Insight: Appropriate  Engagement in Group:  Engaged  Modes of Intervention:  Discussion  Additional Comments:    Beckie Busing 04/11/2023, 12:08 PM

## 2023-04-16 ENCOUNTER — Telehealth (HOSPITAL_COMMUNITY): Payer: Self-pay | Admitting: Professional

## 2023-04-16 ENCOUNTER — Ambulatory Visit (INDEPENDENT_AMBULATORY_CARE_PROVIDER_SITE_OTHER): Payer: Medicaid Other | Admitting: Professional

## 2023-04-16 DIAGNOSIS — F332 Major depressive disorder, recurrent severe without psychotic features: Secondary | ICD-10-CM

## 2023-04-16 NOTE — Psych (Signed)
Virtual Visit via Video Note  I connected with Deziyah Solomon-Council on 04/16/23 at  1:00 PM EDT by a video enabled telemedicine application and verified that I am speaking with the correct person using two identifiers.  Location: Patient: Home Provider: Clinical Home Office   I discussed the limitations of evaluation and management by telemedicine and the availability of in person appointments. The patient expressed understanding and agreed to proceed.  Follow Up Instructions:    I discussed the assessment and treatment plan with the patient. The patient was provided an opportunity to ask questions and all were answered. The patient agreed with the plan and demonstrated an understanding of the instructions.   The patient was advised to call back or seek an in-person evaluation if the symptoms worsen or if the condition fails to improve as anticipated.  I provided 15 minutes of non-face-to-face time during this encounter.   Quinn Axe, [facility]  Cln met with "Kiah" for PHP [facility] per [facility] d/c. Cln orients Kiah to PHP. Elease Etienne reports she is unable to attend due to college classes starting, which she will attend daily during group hours. Elease Etienne reports she is going to see Ula Lingo at [facility] for individual counseling 1x weekly, next appointment tomorrow. She saw "someone" at [facility] on 8/2 for medication follow up but is unable to recall the providers name and reports the provider was covering for someone else, and that it was the providers last day at [facility]. Elease Etienne is unsure of any follow-up appointments for med management. Cln encourages Elease Etienne to call [facility] to clarify if a f/u apt was made, and if not, to make an appointment. Elease Etienne agrees. Elease Etienne reports that she originally wanted group therapy for the support but was able to make connections while inpt and now has a group chat with other patients and feels supported. Cln encourages Elease Etienne to reach out to [facility] counseling department  and to look on their website for support groups offered, as well as other services. Cln also provides information for [facility] for more support. Elease Etienne reports understanding. She denies SI/HI. Cln ecourages Kiah to contact PHP in the future if she needs a higher level of care at that time. Elease Etienne agrees.
